# Patient Record
Sex: Male | Born: 1960 | Race: Black or African American | Hispanic: No | Marital: Single | Smoking: Never smoker
Health system: Southern US, Community
[De-identification: ages and names within clinical notes are randomized; demographics above are authoritative.]

## PROBLEM LIST (undated history)

## (undated) DIAGNOSIS — T7840XA Allergy, unspecified, initial encounter: Secondary | ICD-10-CM

## (undated) DIAGNOSIS — R9431 Abnormal electrocardiogram [ECG] [EKG]: Secondary | ICD-10-CM

## (undated) DIAGNOSIS — D649 Anemia, unspecified: Secondary | ICD-10-CM

## (undated) DIAGNOSIS — R7611 Nonspecific reaction to tuberculin skin test without active tuberculosis: Secondary | ICD-10-CM

## (undated) DIAGNOSIS — Z8701 Personal history of pneumonia (recurrent): Secondary | ICD-10-CM

## (undated) HISTORY — DX: Nonspecific reaction to tuberculin skin test without active tuberculosis: R76.11

## (undated) HISTORY — DX: Anemia, unspecified: D64.9

## (undated) HISTORY — DX: Personal history of pneumonia (recurrent): Z87.01

## (undated) HISTORY — PX: DENTAL SURGERY: SHX609

## (undated) HISTORY — PX: COLONOSCOPY: SHX174

## (undated) HISTORY — DX: Abnormal electrocardiogram (ECG) (EKG): R94.31

## (undated) HISTORY — DX: Allergy, unspecified, initial encounter: T78.40XA

---

## 1999-10-27 DIAGNOSIS — R7611 Nonspecific reaction to tuberculin skin test without active tuberculosis: Secondary | ICD-10-CM

## 1999-10-27 HISTORY — DX: Nonspecific reaction to tuberculin skin test without active tuberculosis: R76.11

## 2007-01-03 ENCOUNTER — Encounter: Payer: Self-pay | Admitting: Internal Medicine

## 2007-01-11 ENCOUNTER — Encounter: Payer: Self-pay | Admitting: Internal Medicine

## 2009-03-08 ENCOUNTER — Encounter: Payer: Self-pay | Admitting: Internal Medicine

## 2009-12-02 ENCOUNTER — Encounter: Payer: Self-pay | Admitting: Internal Medicine

## 2009-12-23 ENCOUNTER — Ambulatory Visit: Payer: Self-pay | Admitting: Internal Medicine

## 2009-12-23 DIAGNOSIS — Z8709 Personal history of other diseases of the respiratory system: Secondary | ICD-10-CM | POA: Insufficient documentation

## 2009-12-23 DIAGNOSIS — J309 Allergic rhinitis, unspecified: Secondary | ICD-10-CM | POA: Insufficient documentation

## 2009-12-23 DIAGNOSIS — D649 Anemia, unspecified: Secondary | ICD-10-CM

## 2009-12-23 DIAGNOSIS — D700 Congenital agranulocytosis: Secondary | ICD-10-CM | POA: Insufficient documentation

## 2009-12-23 LAB — CONVERTED CEMR LAB
Basophils Absolute: 0 10*3/uL (ref 0.0–0.1)
Eosinophils Absolute: 0.2 10*3/uL (ref 0.0–0.7)
Hemoglobin: 12.7 g/dL — ABNORMAL LOW (ref 13.0–17.0)
Lymphocytes Relative: 29.1 % (ref 12.0–46.0)
MCHC: 33.3 g/dL (ref 30.0–36.0)
Monocytes Relative: 13.7 % — ABNORMAL HIGH (ref 3.0–12.0)
Neutrophils Relative %: 48.3 % (ref 43.0–77.0)
PSA: 0.63 ng/mL (ref 0.10–4.00)
RDW: 12.2 % (ref 11.5–14.6)

## 2010-11-25 NOTE — Letter (Signed)
Summary: [facility]   Imported By: Bubba Hales 12/25/2009 09:56:59  _____________________________________________________________________  External Attachment:    Type:   Image     Comment:   External Document

## 2010-11-25 NOTE — Letter (Signed)
Summary: [facility]   Imported By: Bubba Hales 12/25/2009 09:51:14  _____________________________________________________________________  External Attachment:    Type:   Image     Comment:   External Document

## 2010-11-25 NOTE — Letter (Signed)
Summary: Generic Letter  [facility]   [place], [place] [address]   Phone: [phone]  Fax: [phone]      12/23/2009  [place], [place]  [address]  Dear Mr. SELIGA,  This letter serves to document that I have evaluated your problem with a history of low RBC and WBC counts. These abnormalities date back to 1997 and have been persistent and unchanged since then. 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 Your WBC and RBC counts are genetically determined and don't fit into the normal range but this is not an abnormality that presents a medical issue for you.   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 Please contact me for any further concerns.         Sincerely,   Scarlette Calico MD

## 2010-11-25 NOTE — Assessment & Plan Note (Signed)
Summary: NEW / SELF PAY - $184  / # / CD   Vital Signs:  Patient profile:   50 year old male Height:      68 inches Weight:      162.50 pounds BMI:     24.80 O2 Sat:      99 % on Room air Temp:     97.6 degrees F oral Pulse rate:   59 / minute Pulse rhythm:   regular Resp:     16 per minute BP sitting:   124 / 78  (left arm)  Vitals Entered By: Ernestene Mention (December 23, 2009 10:26 AM)  O2 Flow:  Room air CC: NP--est care-C/O sore throat x 2 weeks and runny nose./kb, Preventive Care Is Patient Diabetic? No Pain Assessment Patient in pain? no      Comments Patient denies cough and producing mucous./kb   Primary Care Provider:  Ronnald Ramp  CC:  NP--est care-C/O sore throat x 2 weeks and runny nose./kb and Preventive Care.  History of Present Illness: New to me this gentleman presents with a request to do a repeat CBC and a screening PSA. He was found to have a low WBC and RBC count on labs done during a complete physical on 12/02/09. I reviewed his labs dating back about 15 years ago and it shows a similar finding that has  persisted, unchanged since then (see scanned records). He also requests a screening PSA be done.  Preventive Screening-Counseling & Management  Alcohol-Tobacco     Alcohol drinks/day: <1     Alcohol type: beer     >5/day in last 3 mos: no     Alcohol Counseling: not indicated; use of alcohol is not excessive or problematic     Feels need to cut down: no     Feels annoyed by complaints: no     Feels guilty re: drinking: yes     Needs 'eye opener' in am: no     Smoking Status: never  Caffeine-Diet-Exercise     Does Patient Exercise: yes  Hep-HIV-STD-Contraception     Hepatitis Risk: no risk noted     HIV Risk: no risk noted     STD Risk: no risk noted     TSE monthly: yes     Testicular SE Education/Counseling to perform regular STE      Sexual History:  currently monogamous.        Drug Use:  never and no.        Blood Transfusions:  no.     Current Medications (verified): 1)  Vitamins .... 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 [facility]H) Allergic rhinitis Anemia-NOS  Past Surgical History: None  Family History: Family History High cholesterol Family History Hypertension  Social History: Never Smoked Alcohol use-yes Drug use-no Occupation: Charity fundraiser Regular exercise-yes Smoking Status:  never Drug Use:  never, no Hepatitis Risk:  no risk noted HIV Risk:  no risk noted STD Risk:  no risk noted Sexual History:  currently monogamous Blood Transfusions:  no Does Patient Exercise:  yes  Review of Systems  The patient denies anorexia, fever, weight loss, weight gain, chest pain, syncope, dyspnea on exertion, peripheral edema, prolonged cough, headaches, hemoptysis, abdominal pain, suspicious skin lesions, enlarged lymph nodes, and angioedema.   Heme:  Denies abnormal bruising, bleeding, enlarge lymph nodes,  fevers, pallor, and skin discoloration.  Physical Exam  General:  alert, well-developed, well-nourished, well-hydrated, appropriate dress, normal appearance, healthy-appearing, cooperative to examination, and good hygiene.   Head:  normocephalic, atraumatic, no abnormalities observed, and no abnormalities palpated.   Eyes:  vision grossly intact, pupils equal, pupils round, and pupils reactive to light.   Ears:  R ear normal and L ear normal.   Mouth:  Oral mucosa and oropharynx without lesions or exudates.  Teeth in good repair. Neck:  supple, full ROM, no masses, no thyromegaly, no thyroid nodules or tenderness, no JVD, no carotid bruits, no cervical lymphadenopathy, and no neck tenderness.   Breasts:  No masses or gynecomastia noted Lungs:  normal respiratory effort, no intercostal retractions, no accessory muscle use, normal breath sounds, no  dullness, no fremitus, no crackles, and no wheezes.   Heart:  normal rate, regular rhythm, no murmur, no gallop, no rub, and no JVD.   Abdomen:  soft, non-tender, normal bowel sounds, no distention, no masses, no guarding, no rigidity, no rebound tenderness, no hepatomegaly, and no splenomegaly.   Rectal:  No external abnormalities noted. Normal sphincter tone. No rectal masses or tenderness. Genitalia:  Testes bilaterally descended without nodularity, tenderness or masses. No scrotal masses or lesions. No penis lesions or urethral discharge. Prostate:  Prostate gland firm and smooth, no enlargement, nodularity, tenderness, mass, asymmetry or induration. Msk:  normal ROM, no joint tenderness, no joint swelling, no joint warmth, no redness over joints, no joint deformities, no joint instability, no crepitation, and no muscle atrophy.   Pulses:  R and L carotid,radial,femoral,dorsalis pedis and posterior tibial pulses are full and equal bilaterally Extremities:  No clubbing, cyanosis, edema, or deformity noted with normal full range of motion of all joints.   Neurologic:  No cranial nerve deficits noted. Station and gait are normal. Plantar reflexes are down-going bilaterally. DTRs are symmetrical throughout. Sensory, motor and coordinative functions appear intact. Skin:  turgor normal, color normal, no rashes, no suspicious lesions, no ecchymoses, no petechiae, no purpura, no ulcerations, and no edema.   Cervical Nodes:  no anterior cervical adenopathy and no posterior cervical adenopathy.   Axillary Nodes:  no R axillary adenopathy and no L axillary adenopathy.   Inguinal Nodes:  no R inguinal adenopathy and no L inguinal adenopathy.   Psych:  Cognition and judgment appear intact. Alert and cooperative with normal attention span and concentration. 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  I reiterated the need for healthy,safe living with respect to monogamous and protected sex, limited use of alcohol, avoiding drug abuse, no risk taking, and safe driving/seat belt usage.  Orders: Venipuncture (17793) TLB-CBC Platelet - w/Differential (85025-CBCD) TLB-PSA (Prostate Specific Antigen) (84153-PSA)  Complete Medication List: 1)  Vitamins  .... By mouth qd  Colorectal Screening:  Current Recommendations:    Hemoccult: NEG X 1 today  PSA Screening:    Reviewed PSA screening recommendations: PSA ordered  Immunization & Chemoprophylaxis:    Tetanus vaccine: Td  (05/10/2002)  Patient Instructions: 1)  Please schedule a follow-up appointment as needed. 2)  If you could be  exposed to sexually transmitted diseases, you should use a condom.   Tetanus/Td Immunization History:    Tetanus/Td # 1:  Td (05/10/2002)

## 2011-04-01 ENCOUNTER — Ambulatory Visit (INDEPENDENT_AMBULATORY_CARE_PROVIDER_SITE_OTHER): Payer: Self-pay | Admitting: Internal Medicine

## 2011-04-01 ENCOUNTER — Encounter: Payer: Self-pay | Admitting: Internal Medicine

## 2011-04-01 ENCOUNTER — Ambulatory Visit (INDEPENDENT_AMBULATORY_CARE_PROVIDER_SITE_OTHER)
Admission: RE | Admit: 2011-04-01 | Discharge: 2011-04-01 | Disposition: A | Discharge status: Home / Self Care | Payer: Self-pay | Source: Ambulatory Visit | Attending: Internal Medicine | Admitting: Internal Medicine

## 2011-04-01 VITALS — BP 124/72 | HR 82 | Temp 98.0°F | Resp 16 | Wt 160.0 lb

## 2011-04-01 DIAGNOSIS — R7611 Nonspecific reaction to tuberculin skin test without active tuberculosis: Secondary | ICD-10-CM

## 2011-04-01 NOTE — Patient Instructions (Signed)
Health Maintenance in [place] a healthy diet and normal weight. Increased weight leads to problems with blood pressure and diabetes. Decrease fat in the diet and increase exercise. Obtain a proper diet from your caregiver if necessary.   High blood pressure causes heart and blood vessel problems. Check blood pressures regularly and keep your blood pressure at normal limits. Aerobic exercise helps this. Persistent elevations of blood pressure should be treated with medications if weight loss and exercise are ineffective.   Avoid smoking, drinking in excess (more than 2 drinks per day), or use of street drugs. Do not share needles with anyone. Ask for help if you need assistance or instructions on stopping the use of alcohol, cigarettes, or drugs.   Maintain normal blood lipids and cholesterol. Your caregiver can give you information to lower your risk of heart disease or stroke.   Ask your caregiver if you are in need of early heart disease screening because of a strong family history of heart disease or signs of elevated testosterone (male sex hormone) levels. These can predispose you to early heart disease.   Practice safe sex. Practicing safe sex decreases your risk for a sexually transmitted infection (STI). Some of the STIs are gonorrhea, chlamydia, syphilis, trichimonas, herpes, human papillomavirus (HPV), and human immunodeficiency virus (HIV). Herpes, HIV, and HPV are viral illnesses that have no cure. These can result in disability, cancer, and death.   It is not safe for someone who has AIDS or is HIV positive to have unprotected sex with a partner who is HIV positive. The reason for this is the fact that there are many different strains of HIV. If you have a strain that is readily treated with medications and then suddenly introduce a strain from a partner that has no further treatment options, you may suddenly have a strain of HIV that is untreatable.  Even if you are both positive for HIV, it is still necessary to practice safe sex.   Use sunscreen with a SPF of 15 or greater. Being outside in the sun when your shadow caused by the sun is shorter than you are, means you are being exposed to sun at greater intensity. Lighter skinned people are at a greater risk of skin cancer.   Keep carbon monoxide and smoke detectors in your home and functioning at all times. Change the batteries every 6 months.   Do monthly examinations of your testicles. The best time to do this is after a hot shower or bath when the tissues are loose. Notify your caregivers of any lumps, tenderness, or changes in size or shape.   Notify your caregiver of new moles or changes in moles, especially if there is a change in shape or color. Also notify your caregiver if a mole is larger than the size of a pencil eraser.   Stay current with your tetanus shots and other required immunizations.  The Body Mass Index (BMI) is a way of measuring how much of your body is fat. Having a BMI above 27 increases the risk of heart disease, diabetes, hypertension, stroke, and other problems related to obesity. Document Released: 04/09/2008 Document Re-Released: 04/01/2010 [facility] Patient Information 2011 [place].

## 2011-04-02 ENCOUNTER — Telehealth: Payer: Self-pay | Admitting: *Deleted

## 2011-04-02 ENCOUNTER — Encounter: Payer: Self-pay | Admitting: Internal Medicine

## 2011-04-02 NOTE — Telephone Encounter (Signed)
Yes, normal

## 2011-04-02 NOTE — Progress Notes (Signed)
  Subjective:    Patient ID: Dominic Coleman, male    DOB: 1961/06/26, 50 y.o.   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 Today he feels well and he offers no complaints.    Review of Systems  Constitutional: Negative for fever, chills, diaphoresis, activity change, appetite change, fatigue and unexpected weight change.  HENT: Negative for facial swelling, trouble swallowing, neck pain, neck stiffness and voice change.   Respiratory: Negative for cough, shortness of breath, wheezing and stridor.   Cardiovascular: Negative for chest pain, palpitations and leg swelling.  Gastrointestinal: Negative for nausea, vomiting, abdominal pain, diarrhea and constipation.  Musculoskeletal: Negative for myalgias, back pain, joint swelling, arthralgias and gait problem.  Skin: Negative for pallor and rash.  Hematological: Negative for adenopathy. Does not bruise/bleed easily.  Psychiatric/Behavioral: Negative.        Objective:   Physical Exam  [vitalsreviewed. Constitutional: He is oriented to person, place, and time. He appears well-developed and well-nourished. No distress.  HENT:  Head: Normocephalic and atraumatic.  Right Ear: External ear normal.  Left Ear: External ear normal.  Nose: Nose normal.  Mouth/Throat: Oropharynx is clear and moist. No oropharyngeal exudate.  Eyes: Conjunctivae and EOM are normal. Pupils are equal, round, and reactive to light. Right eye exhibits no discharge. Left eye exhibits no discharge. No scleral icterus.  Neck: Normal range of motion. Neck supple. No JVD present. No tracheal deviation present. No thyromegaly present.  Cardiovascular: Normal rate, regular rhythm, normal heart sounds and  intact distal pulses.  Exam reveals no gallop and no friction rub.   No murmur heard. Pulmonary/Chest: Effort normal and breath sounds normal. No stridor. No respiratory distress. He has no wheezes. He has no rales. He exhibits no tenderness.  Abdominal: Soft. Bowel sounds are normal. He exhibits no distension and no mass. There is no tenderness. There is no rebound and no guarding.  Musculoskeletal: Normal range of motion. He exhibits no edema and no tenderness.  Lymphadenopathy:    He has no cervical adenopathy.  Neurological: He is alert and oriented to person, place, and time. He has normal reflexes. He displays normal reflexes. He exhibits normal muscle tone.  Skin: Skin is warm and dry. No rash noted. He is not diaphoretic. No erythema. No pallor.  Psychiatric: He has a normal mood and affect. His behavior is normal. Judgment and thought content normal.          Assessment & Plan:

## 2011-04-02 NOTE — Telephone Encounter (Signed)
Patient notified

## 2011-04-02 NOTE — Assessment & Plan Note (Signed)
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

## 2011-04-02 NOTE — Telephone Encounter (Signed)
Patient requesting results of CXR, normal correct?

## 2013-04-26 ENCOUNTER — Other Ambulatory Visit (INDEPENDENT_AMBULATORY_CARE_PROVIDER_SITE_OTHER): Payer: Managed Care, Other (non HMO)

## 2013-04-26 ENCOUNTER — Ambulatory Visit (INDEPENDENT_AMBULATORY_CARE_PROVIDER_SITE_OTHER): Payer: Managed Care, Other (non HMO) | Admitting: Internal Medicine

## 2013-04-26 ENCOUNTER — Ambulatory Visit (INDEPENDENT_AMBULATORY_CARE_PROVIDER_SITE_OTHER)
Admission: RE | Admit: 2013-04-26 | Discharge: 2013-04-26 | Disposition: A | Discharge status: Home / Self Care | Payer: Managed Care, Other (non HMO) | Source: Ambulatory Visit | Attending: Internal Medicine | Admitting: Internal Medicine

## 2013-04-26 ENCOUNTER — Encounter: Payer: Self-pay | Admitting: Internal Medicine

## 2013-04-26 VITALS — BP 124/80 | HR 65 | Temp 97.8°F | Resp 16 | Ht 68.0 in | Wt 158.0 lb

## 2013-04-26 DIAGNOSIS — R911 Solitary pulmonary nodule: Secondary | ICD-10-CM | POA: Insufficient documentation

## 2013-04-26 DIAGNOSIS — Z0000 Encounter for general adult medical examination without abnormal findings: Secondary | ICD-10-CM | POA: Insufficient documentation

## 2013-04-26 DIAGNOSIS — R7611 Nonspecific reaction to tuberculin skin test without active tuberculosis: Secondary | ICD-10-CM

## 2013-04-26 LAB — CBC WITH DIFFERENTIAL/PLATELET
Basophils Absolute: 0 10*3/uL (ref 0.0–0.1)
HCT: 38.9 % — ABNORMAL LOW (ref 39.0–52.0)
Lymphocytes Relative: 33.2 % (ref 12.0–46.0)
Lymphs Abs: 1.1 10*3/uL (ref 0.7–4.0)
Monocytes Relative: 14.7 % — ABNORMAL HIGH (ref 3.0–12.0)
Neutrophils Relative %: 49.2 % (ref 43.0–77.0)
Platelets: 279 10*3/uL (ref 150.0–400.0)
RDW: 12.9 % (ref 11.5–14.6)

## 2013-04-26 LAB — URINALYSIS, ROUTINE W REFLEX MICROSCOPIC
Nitrite: NEGATIVE
RBC / HPF: NONE SEEN (ref 0–?)
Specific Gravity, Urine: 1.025 (ref 1.000–1.030)
Total Protein, Urine: NEGATIVE
Urine Glucose: NEGATIVE
pH: 6 (ref 5.0–8.0)

## 2013-04-26 LAB — COMPREHENSIVE METABOLIC PANEL
ALT: 19 U/L (ref 0–53)
CO2: 30 mEq/L (ref 19–32)
GFR: 102.8 mL/min (ref >60.00)
Potassium: 4.1 mEq/L (ref 3.5–5.1)
Sodium: 140 mEq/L (ref 135–145)
Total Bilirubin: 0.7 mg/dL (ref 0.3–1.2)
Total Protein: 7.1 g/dL (ref 6.0–8.3)

## 2013-04-26 LAB — CMP14+EGFR
ALT: 31 U/L (ref 10–40)
Albumin: 4.5
Calcium: 9.6 mg/dL
Chloride, Serum: 102
EGFR: 115 mg/dL
Glucose: 81
Potassium, serum: 4.5
Protein S Total: 7.3

## 2013-04-26 LAB — LIPID PANEL: Cholesterol: 188 mg/dL (ref 0–200)

## 2013-04-26 LAB — TSH: TSH: 1.55 u[IU]/mL (ref 0.35–5.50)

## 2013-04-26 LAB — PSA: PSA: 0.87 ng/mL (ref 0.10–4.00)

## 2013-04-26 NOTE — Progress Notes (Signed)
  Subjective:    Patient ID: Dominic Coleman, male    DOB: [birth year], 52 y.o.   MRN: [id number]  HPI  He returns for a complete physical and he tells me that she feels well and offers no complaints.   Review of Systems  Constitutional: Negative.  Negative for fever, chills, diaphoresis, activity change, appetite change, fatigue and unexpected weight change.  HENT: Negative.   Eyes: Negative.   Respiratory: Negative.  Negative for cough, chest tightness, shortness of breath, wheezing and stridor.   Cardiovascular: Negative.  Negative for chest pain, palpitations and leg swelling.  Gastrointestinal: Negative.  Negative for nausea, vomiting, abdominal pain, diarrhea and constipation.  Endocrine: Negative.   Genitourinary: Negative.   Musculoskeletal: Negative.   Skin: Negative.   Allergic/Immunologic: Negative.   Neurological: Negative.   Hematological: Negative.  Negative for adenopathy. Does not bruise/bleed easily.  Psychiatric/Behavioral: Negative.        Objective:   Physical Exam  Vitals reviewed. Constitutional: He is oriented to person, place, and time. He appears well-developed and well-nourished. No distress.  HENT:  Head: Normocephalic and atraumatic.  Mouth/Throat: Oropharynx is clear and moist. No oropharyngeal exudate.  Eyes: Conjunctivae are normal. Right eye exhibits no discharge. Left eye exhibits no discharge. No scleral icterus.  Neck: Normal range of motion. Neck supple. No JVD present. No tracheal deviation present. No thyromegaly present.  Cardiovascular: Normal rate, regular rhythm, normal heart sounds and intact distal pulses.  Exam reveals no gallop and no friction rub.   No murmur heard. Pulmonary/Chest: Effort normal and breath sounds normal. No stridor. No respiratory distress. He has no wheezes. He has no rales. He exhibits no tenderness.  Abdominal: Soft. Bowel sounds are normal. He exhibits no distension and no mass. There is no tenderness. There is no  rebound and no guarding. Hernia confirmed negative in the right inguinal area and confirmed negative in the left inguinal area.  Genitourinary: Rectum normal, prostate normal, testes normal and penis normal. Rectal exam shows no external hemorrhoid, no internal hemorrhoid, no fissure, no mass, no tenderness and anal tone normal. Guaiac negative stool. Prostate is not enlarged and not tender. Right testis shows no mass, no swelling and no tenderness. Right testis is descended. Left testis shows no mass, no swelling and no tenderness. Left testis is descended. Uncircumcised. No phimosis, paraphimosis, hypospadias, penile erythema or penile tenderness. No discharge found.  Musculoskeletal: Normal range of motion. He exhibits no edema and no tenderness.  Lymphadenopathy:    He has no cervical adenopathy.       Right: No inguinal adenopathy present.       Left: No inguinal adenopathy present.  Neurological: He is oriented to person, place, and time.  Skin: Skin is warm and dry. No rash noted. He is not diaphoretic. No erythema. No pallor.  Psychiatric: He has a normal mood and affect. His behavior is normal. Judgment and thought content normal.     Lab Results  Component Value Date   WBC 3.2* 12/23/2009   HGB 12.7* 12/23/2009   HCT 38.2* 12/23/2009   PLT 265.0 12/23/2009   PSA 0.63 12/23/2009       Assessment & Plan:

## 2013-04-26 NOTE — Assessment & Plan Note (Signed)
Ct scan ordered.

## 2013-04-26 NOTE — Patient Instructions (Signed)
Health Maintenance, Males A healthy lifestyle and preventative care can promote health and wellness.  Maintain regular health, dental, and eye exams.  Eat a healthy diet. Foods like vegetables, fruits, whole grains, low-fat dairy products, and lean protein foods contain the nutrients you need without too many calories. Decrease your intake of foods high in solid fats, added sugars, and salt. Get information about a proper diet from your caregiver, if necessary.  Regular physical exercise is one of the most important things you can do for your health. Most adults should get at least 150 minutes of moderate-intensity exercise (any activity that increases your heart rate and causes you to sweat) each week. In addition, most adults need muscle-strengthening exercises on 2 or more days a week.   Maintain a healthy weight. The body mass index (BMI) is a screening tool to identify possible weight problems. It provides an estimate of body fat based on height and weight. Your caregiver can help determine your BMI, and can help you achieve or maintain a healthy weight. For adults 20 years and older:  A BMI below 18.5 is considered underweight.  A BMI of 18.5 to 24.9 is normal.  A BMI of 25 to 29.9 is considered overweight.  A BMI of 30 and above is considered obese.  Maintain normal blood lipids and cholesterol by exercising and minimizing your intake of saturated fat. Eat a balanced diet with plenty of fruits and vegetables. Blood tests for lipids and cholesterol should begin at age 20 and be repeated every 5 years. If your lipid or cholesterol levels are high, you are over 50, or you are a high risk for heart disease, you may need your cholesterol levels checked more frequently.Ongoing high lipid and cholesterol levels should be treated with medicines, if diet and exercise are not effective.  If you smoke, find out from your caregiver how to quit. If you do not use tobacco, do not start.  If you  choose to drink alcohol, do not exceed 2 drinks per day. One drink is considered to be 12 ounces (355 mL) of beer, 5 ounces (148 mL) of wine, or 1.5 ounces (44 mL) of liquor.  Avoid use of street drugs. Do not share needles with anyone. Ask for help if you need support or instructions about stopping the use of drugs.  High blood pressure causes heart disease and increases the risk of stroke. Blood pressure should be checked at least every 1 to 2 years. Ongoing high blood pressure should be treated with medicines if weight loss and exercise are not effective.  If you are 45 to 50 years old, ask your caregiver if you should take aspirin to prevent heart disease.  Diabetes screening involves taking a blood sample to check your fasting blood sugar level. This should be done once every 3 years, after age 45, if you are within normal weight and without risk factors for diabetes. Testing should be considered at a younger age or be carried out more frequently if you are overweight and have at least 1 risk factor for diabetes.  Colorectal cancer can be detected and often prevented. Most routine colorectal cancer screening begins at the age of 50 and continues through age 75. However, your caregiver may recommend screening at an earlier age if you have risk factors for colon cancer. On a yearly basis, your caregiver may provide home test kits to check for hidden blood in the stool. Use of a small camera at the end of a tube,   to directly examine the colon (sigmoidoscopy or colonoscopy), can detect the earliest forms of colorectal cancer. Talk to your caregiver about this at age 50, when routine screening begins. Direct examination of the colon should be repeated every 5 to 10 years through age 75, unless early forms of pre-cancerous polyps or small growths are found.  Hepatitis C blood testing is recommended for all people born from 1945 through 1965 and any individual with known risks for hepatitis C.  Healthy  men should no longer receive prostate-specific antigen (PSA) blood tests as part of routine cancer screening. Consult with your caregiver about prostate cancer screening.  Testicular cancer screening is not recommended for adolescents or adult males who have no symptoms. Screening includes self-exam, caregiver exam, and other screening tests. Consult with your caregiver about any symptoms you have or any concerns you have about testicular cancer.  Practice safe sex. Use condoms and avoid high-risk sexual practices to reduce the spread of sexually transmitted infections (STIs).  Use sunscreen with a sun protection factor (SPF) of 30 or greater. Apply sunscreen liberally and repeatedly throughout the day. You should seek shade when your shadow is shorter than you. Protect yourself by wearing long sleeves, pants, a wide-brimmed hat, and sunglasses year round, whenever you are outdoors.  Notify your caregiver of new moles or changes in moles, especially if there is a change in shape or color. Also notify your caregiver if a mole is larger than the size of a pencil eraser.  A one-time screening for abdominal aortic aneurysm (AAA) and surgical repair of large AAAs by sound wave imaging (ultrasonography) is recommended for ages 65 to 75 years who are current or former smokers.  Stay current with your immunizations. Document Released: 04/09/2008 Document Revised: 01/04/2012 Document Reviewed: 03/09/2011 [employer] Patient Information 2014 [employer].  

## 2013-04-26 NOTE — Assessment & Plan Note (Signed)
CXR done today, nodule noted He has no s/s suggestive of active TB

## 2013-04-26 NOTE — Assessment & Plan Note (Signed)
Exam done  Vaccines were reviewed He was referred for a colonoscopy Labs ordered Pt ed material was given

## 2013-05-01 ENCOUNTER — Encounter: Payer: Self-pay | Admitting: Internal Medicine

## 2013-05-01 ENCOUNTER — Ambulatory Visit (INDEPENDENT_AMBULATORY_CARE_PROVIDER_SITE_OTHER)
Admission: RE | Admit: 2013-05-01 | Discharge: 2013-05-01 | Disposition: A | Discharge status: Home / Self Care | Payer: Managed Care, Other (non HMO) | Source: Ambulatory Visit | Attending: Internal Medicine | Admitting: Internal Medicine

## 2013-05-01 DIAGNOSIS — R7611 Nonspecific reaction to tuberculin skin test without active tuberculosis: Secondary | ICD-10-CM

## 2013-05-01 DIAGNOSIS — R911 Solitary pulmonary nodule: Secondary | ICD-10-CM

## 2013-05-01 MED ORDER — IOHEXOL 300 MG/ML  SOLN
80.0000 mL | Freq: Once | INTRAMUSCULAR | Status: AC | PRN
  Administered 2013-05-01: 80 mL via INTRAVENOUS

## 2013-05-02 ENCOUNTER — Encounter: Payer: Self-pay | Admitting: Internal Medicine

## 2013-05-03 ENCOUNTER — Encounter: Payer: Self-pay | Admitting: Gastroenterology

## 2013-05-07 ENCOUNTER — Encounter (HOSPITAL_BASED_OUTPATIENT_CLINIC_OR_DEPARTMENT_OTHER): Payer: Self-pay | Admitting: *Deleted

## 2013-05-07 ENCOUNTER — Emergency Department (HOSPITAL_BASED_OUTPATIENT_CLINIC_OR_DEPARTMENT_OTHER)
Admission: EM | Admit: 2013-05-07 | Discharge: 2013-05-08 | Disposition: A | Discharge status: Home / Self Care | Payer: Managed Care, Other (non HMO) | Attending: Emergency Medicine | Admitting: Emergency Medicine

## 2013-05-07 DIAGNOSIS — Z862 Personal history of diseases of the blood and blood-forming organs and certain disorders involving the immune mechanism: Secondary | ICD-10-CM | POA: Insufficient documentation

## 2013-05-07 DIAGNOSIS — R21 Rash and other nonspecific skin eruption: Secondary | ICD-10-CM | POA: Insufficient documentation

## 2013-05-07 DIAGNOSIS — Z8701 Personal history of pneumonia (recurrent): Secondary | ICD-10-CM | POA: Insufficient documentation

## 2013-05-07 MED ORDER — FAMOTIDINE IN NACL 20-0.9 MG/50ML-% IV SOLN
20.0000 mg | Freq: Once | INTRAVENOUS | Status: AC
  Administered 2013-05-07: 20 mg via INTRAVENOUS
  Filled 2013-05-07: qty 50

## 2013-05-07 MED ORDER — DIPHENHYDRAMINE HCL 50 MG/ML IJ SOLN
50.0000 mg | Freq: Once | INTRAMUSCULAR | Status: AC
  Administered 2013-05-07: 50 mg via INTRAVENOUS
  Filled 2013-05-07: qty 1

## 2013-05-07 MED ORDER — SODIUM CHLORIDE 0.9 % IV SOLN
Freq: Once | INTRAVENOUS | Status: AC
  Administered 2013-05-07: 1000 mL via INTRAVENOUS

## 2013-05-07 MED ORDER — DEXAMETHASONE SODIUM PHOSPHATE 10 MG/ML IJ SOLN
10.0000 mg | Freq: Once | INTRAMUSCULAR | Status: AC
  Administered 2013-05-07: 10 mg via INTRAVENOUS
  Filled 2013-05-07: qty 1

## 2013-05-07 NOTE — ED Provider Notes (Signed)
History    This chart was scribed for Dominic Fines, MD, by Dominic Coleman, ED Scribe. This patient was seen in room MH11/MH11 and the patient's care was started at 11:05 PM  CSN: [id number] Arrival date & time 05/07/13  2223  First MD Initiated Contact with Patient 05/07/13 2304     Chief Complaint  Patient presents with  . Allergic Reaction    HPI HPI Comments: Dominic Coleman, a 52 y.o. male, presents to the Emergency Department complaining of a rash. He reports he had to change his bicycle tire in the woods yesterday, and he came into contact with several different plants. The rash began this morning around his pelvis and has spread to his chest, back, and extremities. The pt reports that his inguinal lymph nodes are also swollen. The pt denies any SOB, nausea, emesis, diarrhea, fever, or a cough. The pt also denies eating or drinking anything to which he has a known allergy. The pt took one benadryl this morning, without any resolution. He has a h/o of anemia. The pt denies smoking and he denies using alcohol.   Past Medical History  Diagnosis Date  . Anemia   . Allergy   . History of pneumonia   . Positive PPD   . Abnormal EKG     work up at [employer] in 2008 (+conc. LVH)  . PPD positive 2001   History reviewed. No pertinent past surgical history. Family History  Problem Relation Age of Onset  . Hypertension Other   . Hyperlipidemia Other   . Cancer Neg Hx   . Diabetes Neg Hx   . Early death Neg Hx   . Hearing loss Neg Hx   . Heart disease Neg Hx   . Kidney disease Neg Hx   . Stroke Neg Hx   . Alcohol abuse Neg Hx    History  Substance Use Topics  . Smoking status: Never Smoker   . Smokeless tobacco: Not on file  . Alcohol Use: No    Review of Systems  Constitutional: Negative for fever.  Respiratory: Negative for cough, shortness of breath and wheezing.   Cardiovascular: Negative for chest pain.  Gastrointestinal: Negative for nausea, vomiting and diarrhea.   Skin: Positive for rash.    Allergies  Review of patient's allergies indicates no known allergies.  Home Medications   Current Outpatient Rx  Name  Route  Sig  Dispense  Refill  . Multiple Vitamin (MULTIVITAMIN) tablet   Oral   Take 1 tablet by mouth daily.            Triage Vitals: BP 124/64  Pulse 69  Temp(Src) 98.8 F (37.1 C) (Oral)  Resp 20  Ht 5' 8"  (1.727 m)  Wt 155 lb (70.308 kg)  BMI 23.57 kg/m2  SpO2 100%  Physical Exam  Nursing note and vitals reviewed. Constitutional: He is oriented to person, place, and time. He appears well-developed and well-nourished. No distress.  HENT:  Head: Normocephalic and atraumatic.  Right Ear: External ear normal.  Left Ear: External ear normal.  Nose: Nose normal.  Mouth/Throat: Oropharynx is clear and moist.  Eyes: Conjunctivae and EOM are normal. Pupils are equal, round, and reactive to light.  Neck: Neck supple. No tracheal deviation present.  Cardiovascular: Normal rate.   Pulmonary/Chest: Effort normal and breath sounds normal. No respiratory distress. He has no wheezes.  Musculoskeletal: Normal range of motion.  Lymphadenopathy:    He has no cervical adenopathy.  Inguinal adenopathy  Neurological: He is alert and oriented to person, place, and time.  Skin: Skin is warm and dry.  Generalized urticarial rash most prominent on face, neck, and trunk, becomes sparse on lower legs and forearms. There are areas of confluence.   Psychiatric: He has a normal mood and affect. His behavior is normal.    ED Course  Procedures (including critical care time)  DIAGNOSTIC STUDIES: Oxygen Saturation is 100% on room air, normal by my interpretation.    COORDINATION OF CARE:  11:08PM-Discussed treatment plan with patient, and the patient agreed to the plan.    MDM  1:19 AM Rash fading but still present. The itching significantly improved after IV medications. Rash is most consistent with urticaria with somewhat more  erythematous.  I personally performed the services described in this documentation, which was scribed in my presence.  The recorded information has been reviewed and is accurate.    Dominic Fines, MD 05/08/13 [phone]

## 2013-05-07 NOTE — ED Notes (Signed)
Pt states when he woke up this morning he had a rash that had progressively gotten worse. Hives noted to general body. Took Benardyl this AM that has not helped. Denies any new products, medications, or foods. Denies sob. Main complaint is itching.

## 2013-05-07 NOTE — ED Notes (Signed)
Pt complains of rash is that getting worse since this am even with taking benadryl.  Pt also complains of groin lymph nodes being swollen and tender to touch.  Pt reports in the woods changing a tire but no new medications, detergents or soaps.  Complains of the rash itching but not painful.  Denies shortness of breath.  Denies fever.

## 2013-05-08 MED ORDER — HYDROXYZINE HCL 25 MG PO TABS: 25.0000 mg | ORAL_TABLET | Freq: Four times a day (QID) | ORAL | Status: DC | PRN

## 2013-05-08 NOTE — ED Notes (Signed)
Pt reports feeling better.  Hives and rash appears to be slightly decreasing.  Pt and family agree but rash is still present.

## 2013-06-02 ENCOUNTER — Ambulatory Visit (AMBULATORY_SURGERY_CENTER): Payer: Managed Care, Other (non HMO)

## 2013-06-02 VITALS — Ht 68.0 in | Wt 159.8 lb

## 2013-06-02 DIAGNOSIS — Z1211 Encounter for screening for malignant neoplasm of colon: Secondary | ICD-10-CM

## 2013-06-02 MED ORDER — MOVIPREP 100 G PO SOLR: 1.0000 | Freq: Once | ORAL | Status: DC

## 2013-06-02 NOTE — Progress Notes (Signed)
No egg or soy allergy. No anesthesia problems.  

## 2013-06-19 ENCOUNTER — Encounter: Payer: Self-pay | Admitting: Gastroenterology

## 2013-06-19 ENCOUNTER — Ambulatory Visit (AMBULATORY_SURGERY_CENTER): Payer: Managed Care, Other (non HMO) | Admitting: Gastroenterology

## 2013-06-19 VITALS — BP 123/78 | HR 53 | Temp 100.0°F | Resp 20 | Ht 68.0 in | Wt 159.0 lb

## 2013-06-19 DIAGNOSIS — Z1211 Encounter for screening for malignant neoplasm of colon: Secondary | ICD-10-CM

## 2013-06-19 MED ORDER — SODIUM CHLORIDE 0.9 % IV SOLN: 500.0000 mL | INTRAVENOUS | Status: DC

## 2013-06-19 NOTE — Op Note (Signed)
[facility]  [address] [place], [id number]   COLONOSCOPY PROCEDURE REPORT  PATIENT: Dominic Coleman, Dominic Coleman  MR#: [id number] BIRTHDATE: May 14, 1961 , 52  yrs. old GENDER: Male ENDOSCOPIST: Sable Feil, MD, [facility] REFERRED BY: PROCEDURE DATE:  06/19/2013 PROCEDURE:   Colonoscopy, screening First Screening Colonoscopy - Avg.  risk and is 50 yrs.  old or older Yes.      History of Adenoma - Now for follow-up colonoscopy & has been > or = to 3 yrs.  N/A ASA CLASS:   Class II INDICATIONS:average risk screening. MEDICATIONS: Propofol (Diprivan) 230 mg IV  DESCRIPTION OF PROCEDURE:   After the risks benefits and alternatives of the procedure were thoroughly explained, informed consent was obtained.  A digital rectal exam revealed no abnormalities of the rectum.   The LB [id number]  endoscope was introduced through the anus and advanced to the cecum, which was identified by both the appendix and ileocecal valve. No adverse events experienced.   The quality of the prep was excellent, using MoviPrep  The instrument was then slowly withdrawn as the colon was fully examined.      COLON FINDINGS: A normal appearing cecum, ileocecal valve, and appendiceal orifice were identified.  The ascending, hepatic flexure, transverse, splenic flexure, descending, sigmoid colon and rectum appeared unremarkable.  No polyps or cancers were seen. Retroflexed views revealed no abnormalities. The time to cecum=5 minutes 07 seconds.  Withdrawal time=6 minutes 12 seconds.  The scope was withdrawn and the procedure completed. COMPLICATIONS: There were no complications.  ENDOSCOPIC IMPRESSION: Normal colon ...no polyps noted....  RECOMMENDATIONS: 1.  Continue current medications 2.  Continue current colorectal screening recommendations for "routine risk" patients with a repeat colonoscopy in 10 years.   eSigned:  Sable Feil, MD, [facility] 06/19/2013 9:32 AM   cc:

## 2013-06-19 NOTE — Patient Instructions (Addendum)
YOU HAD AN ENDOSCOPIC PROCEDURE TODAY AT THE [facility]: Refer to the procedure report that was given to you for any specific questions about what was found during the examination.  If the procedure report does not answer your questions, please call your gastroenterologist to clarify.  If you requested that your care partner not be given the details of your procedure findings, then the procedure report has been included in a sealed envelope for you to review at your convenience later.  YOU SHOULD EXPECT: Some feelings of bloating in the abdomen. Passage of more gas than usual.  Walking can help get rid of the air that was put into your GI tract during the procedure and reduce the bloating. If you had a lower endoscopy (such as a colonoscopy or flexible sigmoidoscopy) you may notice spotting of blood in your stool or on the toilet paper. If you underwent a bowel prep for your procedure, then you may not have a normal bowel movement for a few days.  DIET: Your first meal following the procedure should be a light meal and then it is ok to progress to your normal diet.  A half-sandwich or bowl of soup is an example of a good first meal.  Heavy or fried foods are harder to digest and may make you feel nauseous or bloated.  Likewise meals heavy in dairy and vegetables can cause extra gas to form and this can also increase the bloating.  Drink plenty of fluids but you should avoid alcoholic beverages for 24 hours.  ACTIVITY: Your care partner should take you home directly after the procedure.  You should plan to take it easy, moving slowly for the rest of the day.  You can resume normal activity the day after the procedure however you should NOT DRIVE or use heavy machinery for 24 hours (because of the sedation medicines used during the test).    SYMPTOMS TO REPORT IMMEDIATELY: A gastroenterologist can be reached at any hour.  During normal business hours, 8:30 AM to 5:00 PM Monday through Friday,  call [phone].  After hours and on weekends, please call the GI answering service at [phone] who will take a message and have the physician on call contact you.   Following lower endoscopy (colonoscopy or flexible sigmoidoscopy):  Excessive amounts of blood in the stool  Significant tenderness or worsening of abdominal pains  Swelling of the abdomen that is new, acute  Fever of 100F or higher  FOLLOW UP: If any biopsies were taken you will be contacted by phone or by letter within the next 1-3 weeks.  Call your gastroenterologist if you have not heard about the biopsies in 3 weeks.  Our staff will call the home number listed on your records the next business day following your procedure to check on you and address any questions or concerns that you may have at that time regarding the information given to you following your procedure. This is a courtesy call and so if there is no answer at the home number and we have not heard from you through the emergency physician on call, we will assume that you have returned to your regular daily activities without incident.  SIGNATURES/CONFIDENTIALITY: You and/or your care partner have signed paperwork which will be entered into your electronic medical record.  These signatures attest to the fact that that the information above on your After Visit Summary has been reviewed and is understood.  Full responsibility of the confidentiality of this   discharge information lies with you and/or your care-partner.  Resume medications. 

## 2013-06-19 NOTE — Progress Notes (Signed)
Patient did not experience any of the following events: a burn prior to discharge; a fall within the facility; wrong site/side/patient/procedure/implant event; or a hospital transfer or hospital admission upon discharge from the facility. (G8907) Patient did not have preoperative order for IV antibiotic SSI prophylaxis. (G8918)  

## 2013-06-19 NOTE — Progress Notes (Signed)
Procedure ends, to recovery, report given and VSS. 

## 2013-06-20 ENCOUNTER — Telehealth: Payer: Self-pay | Admitting: *Deleted

## 2013-06-20 NOTE — Telephone Encounter (Signed)
  Follow up Call-  Call back number 06/19/2013  Post procedure Call Back phone  # [phone]  Permission to leave phone message Yes     Patient questions:  Do you have a fever, pain , or abdominal swelling? no Pain Score  0 *  Have you tolerated food without any problems? yes  Have you been able to return to your normal activities? yes  Do you have any questions about your discharge instructions: Diet   no Medications  no Follow up visit  no  Do you have questions or concerns about your Care? no  Actions: * If pain score is 4 or above: No action needed, pain <4.

## 2013-08-31 ENCOUNTER — Other Ambulatory Visit: Payer: Self-pay

## 2014-03-22 ENCOUNTER — Ambulatory Visit: Payer: Managed Care, Other (non HMO) | Admitting: Cardiovascular Disease

## 2014-03-22 ENCOUNTER — Encounter: Payer: Self-pay | Admitting: Cardiovascular Disease

## 2014-03-22 ENCOUNTER — Ambulatory Visit (INDEPENDENT_AMBULATORY_CARE_PROVIDER_SITE_OTHER): Payer: Managed Care, Other (non HMO) | Admitting: Cardiovascular Disease

## 2014-03-22 VITALS — BP 142/76 | HR 66 | Ht 68.0 in | Wt 157.8 lb

## 2014-03-22 DIAGNOSIS — R9431 Abnormal electrocardiogram [ECG] [EKG]: Secondary | ICD-10-CM | POA: Insufficient documentation

## 2014-03-22 NOTE — Assessment & Plan Note (Addendum)
Patient has a history of abnormal EKG. I evaluated him back in 2008. He has septal Q waves, LVH voltage and peaked T waves. His 2-D echo was normal. He is completely asymptomatic and he is a young man without cardiovascular risk factors. I believe this is a "normal EKG  for him and does not need to be further evaluated

## 2014-03-22 NOTE — Patient Instructions (Signed)
Your physician recommends that you schedule a follow-up appointment as needed .No changes have been made today in your therapy.

## 2014-03-22 NOTE — Progress Notes (Signed)
     03/22/2014 Dominic Coleman   [birth year]  [id number]  Primary Physician Scarlette Calico, MD Primary Cardiologist: Lorretta Harp MD Renae Gloss   HPI:  Dominic Coleman is a delightful 53 year old thin and fit appearing single African American male who is retired from the [employer] where he worked for 20 years and now works for the [employer]. I initially saw him back in 2008 as a referral for an abnormal EKG. His EKG was notable for septal Q waves, LVH voltage and peaked T waves. A 2-D echo was normal. He is completely asymptomatic. He has no cardiovascular risk factors. He exercises frequently without symptoms or limitations. He is referred back by the [employer] for cardiovascular clearance because of his EKG which has been previously noted and cleared.   Current Outpatient Prescriptions  Medication Sig Dispense Refill  . Multiple Vitamin (MULTIVITAMIN) tablet Take 1 tablet by mouth daily.         No current facility-administered medications for this visit.    No Known Allergies  History   Social History  . Marital Status: Single    Spouse Name: N/A    Number of Children: N/A  . Years of Education: N/A   Occupational History  . Not on file.   Social History Main Topics  . Smoking status: Never Smoker   . Smokeless tobacco: Never Used  . Alcohol Use: Yes     Comment: 3 drinks containing 0.5 oz of alcholo per month  . Drug Use: No  . Sexual Activity: Yes   Other Topics Concern  . Not on file   Social History Narrative  . No narrative on file     Review of Systems: General: negative for chills, fever, night sweats or weight changes.  Cardiovascular: negative for chest pain, dyspnea on exertion, edema, orthopnea, palpitations, paroxysmal nocturnal dyspnea or shortness of breath Dermatological: negative for rash Respiratory: negative for cough or wheezing Urologic: negative for hematuria Abdominal: negative for nausea, vomiting, diarrhea, bright red blood  per rectum, melena, or hematemesis Neurologic: negative for visual changes, syncope, or dizziness All other systems reviewed and are otherwise negative except as noted above.    Blood pressure 142/76, pulse 66, height 5' 8"  (1.727 m), weight 157 lb 12.8 oz (71.578 kg).  General appearance: alert and no distress Neck: no adenopathy, no carotid bruit, no JVD, supple, symmetrical, trachea midline and thyroid not enlarged, symmetric, no tenderness/mass/nodules Lungs: clear to auscultation bilaterally Heart: regular rate and rhythm, S1, S2 normal, no murmur, click, rub or gallop Extremities: extremities normal, atraumatic, no cyanosis or edema  EKG normal sinus rhythm at 66 with septal Q waves, LVH voltage and peaked T waves unchanged from his prior EKG.  ASSESSMENT AND PLAN:   Abnormal EKG Patient has a history of abnormal EKG. I evaluated him back in 2008. He has septal Q waves, LVH voltage and peaked T waves. His 2-D echo was normal. He is completely astigmatic and effect young man without cardiovascular risk factors. I believe this is a "normal EKG and put for him and does not need to be further evaluated      Lorretta Harp MD [facility], [facility] 03/22/2014 8:14 AM

## 2014-03-23 ENCOUNTER — Ambulatory Visit: Payer: Managed Care, Other (non HMO) | Admitting: Cardiovascular Disease

## 2014-03-23 ENCOUNTER — Telehealth: Payer: Self-pay | Admitting: *Deleted

## 2014-03-23 NOTE — Telephone Encounter (Signed)
Called patient advising him he can come by to get 03/22/14 office progress note to take to [employer] guard clearing him for service. If after reviewed and they need additional information we will be happy to assist him.

## 2014-03-27 ENCOUNTER — Ambulatory Visit: Payer: Managed Care, Other (non HMO) | Admitting: Physician Assistant

## 2015-05-14 ENCOUNTER — Other Ambulatory Visit (INDEPENDENT_AMBULATORY_CARE_PROVIDER_SITE_OTHER): Payer: Managed Care, Other (non HMO)

## 2015-05-14 ENCOUNTER — Ambulatory Visit (INDEPENDENT_AMBULATORY_CARE_PROVIDER_SITE_OTHER): Payer: Managed Care, Other (non HMO) | Admitting: Internal Medicine

## 2015-05-14 ENCOUNTER — Encounter: Payer: Self-pay | Admitting: Internal Medicine

## 2015-05-14 VITALS — BP 118/78 | HR 71 | Temp 98.5°F | Resp 16 | Ht 68.0 in | Wt 152.8 lb

## 2015-05-14 DIAGNOSIS — Z0000 Encounter for general adult medical examination without abnormal findings: Secondary | ICD-10-CM | POA: Diagnosis not present

## 2015-05-14 LAB — CBC WITH DIFFERENTIAL/PLATELET
BASOS PCT: 0.4 % (ref 0.0–3.0)
Basophils Absolute: 0 10*3/uL (ref 0.0–0.1)
EOS PCT: 3.2 % (ref 0.0–5.0)
Eosinophils Absolute: 0.1 10*3/uL (ref 0.0–0.7)
HCT: 41.2 % (ref 39.0–52.0)
HEMOGLOBIN: 13.9 g/dL (ref 13.0–17.0)
Lymphocytes Relative: 32.4 % (ref 12.0–46.0)
Lymphs Abs: 0.9 10*3/uL (ref 0.7–4.0)
MCHC: 33.7 g/dL (ref 30.0–36.0)
MCV: 91.7 fl (ref 78.0–100.0)
Monocytes Absolute: 0.4 10*3/uL (ref 0.1–1.0)
Monocytes Relative: 13.2 % — ABNORMAL HIGH (ref 3.0–12.0)
NEUTROS ABS: 1.4 10*3/uL (ref 1.4–7.7)
NEUTROS PCT: 50.8 % (ref 43.0–77.0)
Platelets: 282 10*3/uL (ref 150.0–400.0)
RBC: 4.49 Mil/uL (ref 4.22–5.81)
RDW: 12.7 % (ref 11.5–15.5)
WBC: 2.8 10*3/uL — ABNORMAL LOW (ref 4.0–10.5)

## 2015-05-14 LAB — COMPREHENSIVE METABOLIC PANEL
ALT: 17 U/L (ref 0–53)
AST: 25 U/L (ref 0–37)
Albumin: 4.2 g/dL (ref 3.5–5.2)
Alkaline Phosphatase: 78 U/L (ref 39–117)
BILIRUBIN TOTAL: 0.5 mg/dL (ref 0.2–1.2)
BUN: 21 mg/dL (ref 6–23)
CO2: 29 mEq/L (ref 19–32)
CREATININE: 0.96 mg/dL (ref 0.40–1.50)
Calcium: 9.5 mg/dL (ref 8.4–10.5)
Chloride: 104 mEq/L (ref 96–112)
GFR: 105.65 mL/min (ref >60.00)
GLUCOSE: 79 mg/dL (ref 70–99)
POTASSIUM: 4.2 meq/L (ref 3.5–5.1)
Sodium: 141 mEq/L (ref 135–145)
TOTAL PROTEIN: 7.1 g/dL (ref 6.0–8.3)

## 2015-05-14 LAB — LIPID PANEL
Cholesterol: 188 mg/dL (ref 0–200)
HDL: 65.1 mg/dL (ref >39.00)
LDL Cholesterol: 115 mg/dL — ABNORMAL HIGH (ref 0–99)
NonHDL: 122.9
Total CHOL/HDL Ratio: 3
Triglycerides: 40 mg/dL (ref 0.0–149.0)
VLDL: 8 mg/dL (ref 0.0–40.0)

## 2015-05-14 LAB — PSA: PSA: 0.69 ng/mL (ref 0.10–4.00)

## 2015-05-14 LAB — TSH: TSH: 1.32 u[IU]/mL (ref 0.35–4.50)

## 2015-05-14 LAB — FECAL OCCULT BLOOD, GUAIAC: FECAL OCCULT BLD: NEGATIVE

## 2015-05-14 NOTE — Progress Notes (Signed)
Pre visit review using our clinic review tool, if applicable. No additional management support is needed unless otherwise documented below in the visit note. 

## 2015-05-14 NOTE — Patient Instructions (Signed)

## 2015-05-14 NOTE — Progress Notes (Signed)
Subjective:  Patient ID: [name], male    DOB: 11-16-1960  Age: 54 y.o. MRN: [id number]  CC: Annual Exam   HPI Brigido Mera presents for a CPX - he feels well and offers no complaints. He runs about 5 miles every other day.  Outpatient Prescriptions Prior to Visit  Medication Sig Dispense Refill  . Multiple Vitamin (MULTIVITAMIN) tablet Take 1 tablet by mouth daily.       No facility-administered medications prior to visit.    ROS Review of Systems  Constitutional: Negative.   HENT: Negative.   Eyes: Negative.   Respiratory: Negative.  Negative for cough, choking, chest tightness, shortness of breath, wheezing and stridor.   Cardiovascular: Negative.  Negative for chest pain, palpitations and leg swelling.  Gastrointestinal: Negative.  Negative for nausea, abdominal pain, diarrhea, constipation and blood in stool.  Endocrine: Negative.   Genitourinary: Negative.  Negative for urgency, frequency, decreased urine volume and difficulty urinating.  Musculoskeletal: Negative.   Skin: Negative.   Allergic/Immunologic: Negative.   Neurological: Negative.   Hematological: Negative.   Psychiatric/Behavioral: Negative.     Objective:  BP 118/78 mmHg  Pulse 71  Temp(Src) 98.5 F (36.9 C) (Oral)  Resp 16  Ht 5' 8"  (1.727 m)  Wt 152 lb 12 oz (69.287 kg)  BMI 23.23 kg/m2  SpO2 99%  BP Readings from Last 3 Encounters:  05/14/15 118/78  03/22/14 142/76  06/19/13 123/78    Wt Readings from Last 3 Encounters:  05/14/15 152 lb 12 oz (69.287 kg)  03/22/14 157 lb 12.8 oz (71.578 kg)  06/19/13 159 lb (72.122 kg)    Physical Exam  Constitutional: He is oriented to person, place, and time. He appears well-developed and well-nourished. No distress.  HENT:  Head: Normocephalic and atraumatic.  Mouth/Throat: Oropharynx is clear and moist. No oropharyngeal exudate.  Eyes: Conjunctivae are normal. Right eye exhibits no discharge. Left eye exhibits no discharge. No scleral  icterus.  Neck: Normal range of motion. Neck supple. No JVD present. No tracheal deviation present. No thyromegaly present.  Cardiovascular: Normal rate, regular rhythm, normal heart sounds and intact distal pulses.  Exam reveals no gallop and no friction rub.   No murmur heard. Pulmonary/Chest: Effort normal and breath sounds normal. No stridor. No respiratory distress. He has no wheezes. He has no rales. He exhibits no tenderness.  Abdominal: Soft. Bowel sounds are normal. He exhibits no distension and no mass. There is no tenderness. There is no rebound and no guarding. Hernia confirmed negative in the right inguinal area and confirmed negative in the left inguinal area.  Genitourinary: Rectum normal, prostate normal, testes normal and penis normal. Rectal exam shows no external hemorrhoid, no internal hemorrhoid, no fissure, no mass and no tenderness. Guaiac negative stool. Prostate is not enlarged and not tender. Right testis shows no mass, no swelling and no tenderness. Right testis is descended. Left testis shows no mass, no swelling and no tenderness. Left testis is descended. Uncircumcised. No phimosis, paraphimosis, hypospadias, penile erythema or penile tenderness. No discharge found.  Musculoskeletal: Normal range of motion. He exhibits no tenderness.  Lymphadenopathy:    He has no cervical adenopathy.       Right: No inguinal adenopathy present.       Left: No inguinal adenopathy present.  Neurological: He is oriented to person, place, and time.  Skin: Skin is warm and dry. No rash noted. He is not diaphoretic. No erythema. No pallor.  Psychiatric: He has a normal mood  and affect. His behavior is normal. Judgment and thought content normal.    Lab Results  Component Value Date   WBC 2.8* 05/14/2015   HGB 13.9 05/14/2015   HCT 41.2 05/14/2015   PLT 282.0 05/14/2015   GLUCOSE 79 05/14/2015   CHOL 188 05/14/2015   TRIG 40.0 05/14/2015   HDL 65.10 05/14/2015   LDLCALC 115*  05/14/2015   ALT 17 05/14/2015   AST 25 05/14/2015   NA 141 05/14/2015   K 4.2 05/14/2015   CL 104 05/14/2015   CREATININE 0.96 05/14/2015   BUN 21 05/14/2015   CO2 29 05/14/2015   TSH 1.32 05/14/2015   PSA 0.69 05/14/2015    No results found.  Assessment & Plan:   Julyan was seen today for annual exam.  Diagnoses and all orders for this visit:  Routine general medical examination at a health care facility- exam done, labs ordered and reviewed, vaccines were reviewed and updated, pt ed material was given. Orders: -     Lipid panel; Future -     Comprehensive metabolic panel; Future -     CBC with Differential/Platelet; Future -     TSH; Future -     PSA; Future   I am having Mr. Frankowski maintain his multivitamin.  No orders of the defined types were placed in this encounter.     Follow-up: Return in about 1 year (around 05/13/2016).  Scarlette Calico, MD

## 2015-12-09 LAB — BASIC METABOLIC PANEL
BUN: 22 mg/dL — AB (ref 4–21)
CREATININE: 0.8 mg/dL (ref 0.6–1.3)
Glucose: 75 mg/dL
POTASSIUM: 4.6 mmol/L (ref 3.4–5.3)
Sodium: 143 mmol/L (ref 137–147)

## 2015-12-09 LAB — HEPATIC FUNCTION PANEL
AST: 31 U/L (ref 14–40)
Alkaline Phosphatase: 76 U/L (ref 25–125)
Bilirubin, Total: 0.3 mg/dL

## 2016-01-22 ENCOUNTER — Encounter: Payer: Self-pay | Admitting: Internal Medicine

## 2016-01-22 ENCOUNTER — Other Ambulatory Visit (INDEPENDENT_AMBULATORY_CARE_PROVIDER_SITE_OTHER): Payer: Managed Care, Other (non HMO)

## 2016-01-22 ENCOUNTER — Ambulatory Visit (INDEPENDENT_AMBULATORY_CARE_PROVIDER_SITE_OTHER): Payer: Managed Care, Other (non HMO) | Admitting: Internal Medicine

## 2016-01-22 VITALS — BP 106/64 | HR 62 | Temp 98.5°F | Resp 16 | Ht 68.0 in | Wt 158.0 lb

## 2016-01-22 DIAGNOSIS — Z0000 Encounter for general adult medical examination without abnormal findings: Secondary | ICD-10-CM | POA: Diagnosis not present

## 2016-01-22 LAB — FECAL OCCULT BLOOD, GUAIAC: FECAL OCCULT BLD: NEGATIVE

## 2016-01-22 LAB — CBC WITH DIFFERENTIAL/PLATELET
BASOS PCT: 0.6 % (ref 0.0–3.0)
Basophils Absolute: 0 10*3/uL (ref 0.0–0.1)
EOS PCT: 4.8 % (ref 0.0–5.0)
Eosinophils Absolute: 0.1 10*3/uL (ref 0.0–0.7)
HEMATOCRIT: 39.4 % (ref 39.0–52.0)
HEMOGLOBIN: 13.4 g/dL (ref 13.0–17.0)
LYMPHS PCT: 28.9 % (ref 12.0–46.0)
Lymphs Abs: 0.9 10*3/uL (ref 0.7–4.0)
MCHC: 34.1 g/dL (ref 30.0–36.0)
MCV: 90.9 fl (ref 78.0–100.0)
MONO ABS: 0.4 10*3/uL (ref 0.1–1.0)
MONOS PCT: 14.6 % — AB (ref 3.0–12.0)
Neutro Abs: 1.5 10*3/uL (ref 1.4–7.7)
Neutrophils Relative %: 51.1 % (ref 43.0–77.0)
Platelets: 325 10*3/uL (ref 150.0–400.0)
RBC: 4.34 Mil/uL (ref 4.22–5.81)
RDW: 13 % (ref 11.5–15.5)
WBC: 3 10*3/uL — AB (ref 4.0–10.5)

## 2016-01-22 LAB — LIPID PANEL
CHOL/HDL RATIO: 3
Cholesterol: 209 mg/dL — ABNORMAL HIGH (ref 0–200)
HDL: 63.9 mg/dL (ref >39.00)
LDL CALC: 134 mg/dL — AB (ref 0–99)
NonHDL: 145.57
Triglycerides: 58 mg/dL (ref 0.0–149.0)
VLDL: 11.6 mg/dL (ref 0.0–40.0)

## 2016-01-22 LAB — PSA: PSA: 1.34 ng/mL (ref 0.10–4.00)

## 2016-01-22 LAB — HEMOGLOBIN A1C: HEMOGLOBIN A1C: 6.1 % (ref 4.6–6.5)

## 2016-01-22 LAB — HEPATITIS C ANTIBODY: HCV Ab: NEGATIVE

## 2016-01-22 LAB — TSH: TSH: 1.11 u[IU]/mL (ref 0.35–4.50)

## 2016-01-22 NOTE — Patient Instructions (Signed)

## 2016-01-22 NOTE — Progress Notes (Signed)
Subjective:  Patient ID: Dominic Coleman, male    DOB: [birth year]  Age: 55 y.o. MRN: [id number]  CC: Annual Exam   HPI Hewitt Garner presents for a CPX - he feels well and has no complaints.  History Mehdi has a past medical history of Anemia; Allergy; History of pneumonia; Positive PPD; Abnormal EKG; and PPD positive (2001).   He has no past surgical history on file.   His family history includes Colon polyps in his sister; Hyperlipidemia in his other; Hypertension in his other. There is no history of Cancer, Diabetes, Early death, Hearing loss, Kidney disease, Stroke, or Alcohol abuse.He reports that he has never smoked. He has never used smokeless tobacco. He reports that he drinks alcohol. He reports that he does not use illicit drugs.  Outpatient Prescriptions Prior to Visit  Medication Sig Dispense Refill  . Multiple Vitamin (MULTIVITAMIN) tablet Take 1 tablet by mouth daily.       No facility-administered medications prior to visit.    ROS Review of Systems  Constitutional: Negative.  Negative for appetite change and unexpected weight change.  HENT: Negative.   Eyes: Negative.   Respiratory: Negative.  Negative for cough, choking, chest tightness, shortness of breath and stridor.   Cardiovascular: Negative.  Negative for chest pain, palpitations and leg swelling.  Gastrointestinal: Negative.  Negative for abdominal pain.  Endocrine: Negative.   Genitourinary: Negative.  Negative for urgency, scrotal swelling, difficulty urinating and testicular pain.  Musculoskeletal: Negative.   Skin: Negative.  Negative for color change and rash.  Allergic/Immunologic: Negative.   Neurological: Negative.  Negative for dizziness.  Hematological: Negative.  Negative for adenopathy. Does not bruise/bleed easily.  Psychiatric/Behavioral: Negative.     Objective:  BP 106/64 mmHg  Pulse 62  Temp(Src) 98.5 F (36.9 C) (Oral)  Resp 16  Ht 5' 8"  (1.727 m)  Wt 158 lb (71.668 kg)  BMI  24.03 kg/m2  SpO2 97%  Physical Exam  Constitutional: He is oriented to person, place, and time. He appears well-developed and well-nourished. No distress.  HENT:  Mouth/Throat: Oropharynx is clear and moist. No oropharyngeal exudate.  Eyes: Conjunctivae are normal. Right eye exhibits no discharge. Left eye exhibits no discharge. No scleral icterus.  Neck: Normal range of motion. Neck supple. No JVD present. No tracheal deviation present. No thyromegaly present.  Cardiovascular: Normal rate, regular rhythm, normal heart sounds and intact distal pulses.  Exam reveals no gallop and no friction rub.   No murmur heard. Pulmonary/Chest: Effort normal and breath sounds normal. No stridor. No respiratory distress. He has no wheezes. He has no rales. He exhibits no tenderness.  Abdominal: Soft. Bowel sounds are normal. He exhibits no distension and no mass. There is no tenderness. There is no rebound and no guarding. Hernia confirmed negative in the right inguinal area and confirmed negative in the left inguinal area.  Genitourinary: Rectum normal, prostate normal, testes normal and penis normal. Rectal exam shows no external hemorrhoid, no internal hemorrhoid, no fissure, no mass, no tenderness and anal tone normal. Guaiac negative stool. Prostate is not enlarged and not tender. Right testis shows no mass, no swelling and no tenderness. Right testis is descended. Left testis shows no mass, no swelling and no tenderness. Left testis is descended. Uncircumcised. No phimosis, paraphimosis, hypospadias, penile erythema or penile tenderness. No discharge found.  Musculoskeletal: Normal range of motion. He exhibits no edema or tenderness.  Lymphadenopathy:    He has no cervical adenopathy.  Right: No inguinal adenopathy present.       Left: No inguinal adenopathy present.  Neurological: He is oriented to person, place, and time.  Skin: Skin is warm. No rash noted. He is not diaphoretic. No erythema. No  pallor.  Psychiatric: He has a normal mood and affect. His behavior is normal. Judgment and thought content normal.  Vitals reviewed.     Assessment & Plan:   Babak was seen today for annual exam.  Diagnoses and all orders for this visit:  Routine general medical examination at a health care facility- He refuses a flu vaccine, exam completed, labs ordered and reviewed, medication education material was given, his colonoscopy is up-to-date. -     CBC with Differential/Platelet; Future -     Lipid panel; Future -     Hemoglobin A1c; Future -     TSH; Future -     PSA; Future -     Hepatitis C antibody; Future -     HIV antibody; Future  I am having Mr. Pignato maintain his multivitamin.  No orders of the defined types were placed in this encounter.     Follow-up: Return if symptoms worsen or fail to improve.  Scarlette Calico, MD

## 2016-01-23 ENCOUNTER — Encounter: Payer: Self-pay | Admitting: Internal Medicine

## 2016-01-23 LAB — HIV ANTIBODY (ROUTINE TESTING W REFLEX): HIV 1&2 Ab, 4th Generation: NONREACTIVE

## 2016-11-20 ENCOUNTER — Other Ambulatory Visit (HOSPITAL_COMMUNITY): Payer: Self-pay | Admitting: Otolaryngology

## 2016-11-20 DIAGNOSIS — R1319 Other dysphagia: Secondary | ICD-10-CM

## 2016-12-01 ENCOUNTER — Ambulatory Visit (HOSPITAL_COMMUNITY)
Admission: RE | Admit: 2016-12-01 | Discharge: 2016-12-01 | Disposition: A | Discharge status: Home / Self Care | Payer: Managed Care, Other (non HMO) | Source: Ambulatory Visit | Attending: Otolaryngology | Admitting: Otolaryngology

## 2016-12-01 DIAGNOSIS — Z8701 Personal history of pneumonia (recurrent): Secondary | ICD-10-CM | POA: Insufficient documentation

## 2016-12-01 DIAGNOSIS — D649 Anemia, unspecified: Secondary | ICD-10-CM | POA: Insufficient documentation

## 2016-12-01 DIAGNOSIS — R1319 Other dysphagia: Secondary | ICD-10-CM

## 2018-04-11 ENCOUNTER — Encounter: Payer: Self-pay | Admitting: Internal Medicine

## 2018-04-11 ENCOUNTER — Ambulatory Visit (INDEPENDENT_AMBULATORY_CARE_PROVIDER_SITE_OTHER): Payer: Managed Care, Other (non HMO) | Admitting: Internal Medicine

## 2018-04-11 ENCOUNTER — Other Ambulatory Visit (INDEPENDENT_AMBULATORY_CARE_PROVIDER_SITE_OTHER): Payer: Managed Care, Other (non HMO)

## 2018-04-11 VITALS — BP 122/84 | HR 65 | Temp 98.6°F | Ht 68.0 in | Wt 155.0 lb

## 2018-04-11 DIAGNOSIS — R21 Rash and other nonspecific skin eruption: Secondary | ICD-10-CM

## 2018-04-11 DIAGNOSIS — Z0000 Encounter for general adult medical examination without abnormal findings: Secondary | ICD-10-CM

## 2018-04-11 DIAGNOSIS — R202 Paresthesia of skin: Secondary | ICD-10-CM | POA: Insufficient documentation

## 2018-04-11 DIAGNOSIS — E785 Hyperlipidemia, unspecified: Secondary | ICD-10-CM | POA: Insufficient documentation

## 2018-04-11 LAB — CBC WITH DIFFERENTIAL/PLATELET
BASOS ABS: 0 10*3/uL (ref 0.0–0.1)
Basophils Relative: 0.8 % (ref 0.0–3.0)
EOS ABS: 0.1 10*3/uL (ref 0.0–0.7)
Eosinophils Relative: 3.4 % (ref 0.0–5.0)
HCT: 39.1 % (ref 39.0–52.0)
Hemoglobin: 13.8 g/dL (ref 13.0–17.0)
Lymphocytes Relative: 33.3 % (ref 12.0–46.0)
Lymphs Abs: 1 10*3/uL (ref 0.7–4.0)
MCHC: 35.2 g/dL (ref 30.0–36.0)
MCV: 92.9 fl (ref 78.0–100.0)
MONOS PCT: 13.6 % — AB (ref 3.0–12.0)
Monocytes Absolute: 0.4 10*3/uL (ref 0.1–1.0)
Neutro Abs: 1.5 10*3/uL (ref 1.4–7.7)
Neutrophils Relative %: 48.9 % (ref 43.0–77.0)
PLATELETS: 277 10*3/uL (ref 150.0–400.0)
RBC: 4.21 Mil/uL — AB (ref 4.22–5.81)
RDW: 12.3 % (ref 11.5–15.5)
WBC: 3.1 10*3/uL — ABNORMAL LOW (ref 4.0–10.5)

## 2018-04-11 LAB — HEPATIC FUNCTION PANEL
ALK PHOS: 70 U/L (ref 39–117)
ALT: 19 U/L (ref 0–53)
AST: 25 U/L (ref 0–37)
Albumin: 4.2 g/dL (ref 3.5–5.2)
BILIRUBIN DIRECT: 0.1 mg/dL (ref 0.0–0.3)
BILIRUBIN TOTAL: 0.5 mg/dL (ref 0.2–1.2)
Total Protein: 7.2 g/dL (ref 6.0–8.3)

## 2018-04-11 LAB — URINALYSIS, ROUTINE W REFLEX MICROSCOPIC
BILIRUBIN URINE: NEGATIVE
HGB URINE DIPSTICK: NEGATIVE
Ketones, ur: NEGATIVE
Leukocytes, UA: NEGATIVE
NITRITE: NEGATIVE
RBC / HPF: NONE SEEN (ref 0–?)
Specific Gravity, Urine: >=1.03 — AB (ref 1.000–1.030)
Total Protein, Urine: NEGATIVE
Urine Glucose: NEGATIVE
Urobilinogen, UA: 0.2 (ref 0.0–1.0)
pH: 6 (ref 5.0–8.0)

## 2018-04-11 LAB — BASIC METABOLIC PANEL
BUN: 22 mg/dL (ref 6–23)
CO2: 29 meq/L (ref 19–32)
CREATININE: 1.03 mg/dL (ref 0.40–1.50)
Calcium: 9.5 mg/dL (ref 8.4–10.5)
Chloride: 104 mEq/L (ref 96–112)
GFR: 96.35 mL/min (ref >60.00)
GLUCOSE: 85 mg/dL (ref 70–99)
Potassium: 3.9 mEq/L (ref 3.5–5.1)
Sodium: 141 mEq/L (ref 135–145)

## 2018-04-11 LAB — LIPID PANEL
CHOL/HDL RATIO: 3
Cholesterol: 197 mg/dL (ref 0–200)
HDL: 72.4 mg/dL (ref >39.00)
LDL CALC: 111 mg/dL — AB (ref 0–99)
NONHDL: 124.24
TRIGLYCERIDES: 64 mg/dL (ref 0.0–149.0)
VLDL: 12.8 mg/dL (ref 0.0–40.0)

## 2018-04-11 LAB — PSA: PSA: 1 ng/mL (ref 0.10–4.00)

## 2018-04-11 LAB — TSH: TSH: 1.36 u[IU]/mL (ref 0.35–4.50)

## 2018-04-11 MED ORDER — TRIAMCINOLONE ACETONIDE 0.1 % EX CREA: 1.0000 "application " | TOPICAL_CREAM | Freq: Two times a day (BID) | CUTANEOUS | 0 refills | Status: AC

## 2018-04-11 NOTE — Patient Instructions (Addendum)
Please take all new medication as prescribed - the cream for the arm as needed  Please continue all other medications as before, and refills have been done if requested.  Please have the pharmacy call with any other refills you may need.  Please continue your efforts at being more active, low cholesterol diet, and weight control.  You are otherwise up to date with prevention measures today.  Please keep your appointments with your specialists as you may have planned  Please go to the LAB in the Basement (turn left off the elevator) for the tests to be done today  You will be contacted by phone if any changes need to be made immediately.  Otherwise, you will receive a letter about your results with an explanation, but please check with MyChart first.  Please remember to sign up for MyChart if you have not done so, as this will be important to you in the future with finding out test results, communicating by private email, and scheduling acute appointments online when needed.  Please return in 1 year for your yearly visit, or sooner if needed, with Lab testing done 3-5 days before

## 2018-04-11 NOTE — Assessment & Plan Note (Signed)
C/w mild dermatitis, for triam cr prn

## 2018-04-11 NOTE — Assessment & Plan Note (Signed)

## 2018-04-11 NOTE — Assessment & Plan Note (Signed)
C/w mild left ulnar neuritis, exam benign, will hold on NCS for now, for tylenol prn

## 2018-04-11 NOTE — Progress Notes (Signed)
Subjective:    Patient ID: Dominic Coleman, male    DOB: 1961-06-02, 57 y.o.   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  Overall doing ok;  Pt denies Chest pain, worsening SOB, DOE, wheezing, orthopnea, PND, worsening LE edema, palpitations, dizziness or syncope.  Pt denies neurological change such as new headache, facial or extremity weakness.  Pt denies polydipsia, polyuria, or low sugar symptoms. Pt states overall good compliance with treatment and medications, good tolerability, and has been trying to follow appropriate diet.  Pt denies worsening depressive symptoms, suicidal ideation or panic. No fever, night sweats, wt loss, loss of appetite, or other constitutional symptoms.  Pt states good ability with ADL's, has low fall risk, home safety reviewed and adequate, no other significant changes in hearing or vision, and regularly active with exercise, mostly home calisthenics daily.  Has a rash to the left arm with itching for several weeks, also has 6 mo LUE numb, tingling beginning at the left elbow with radiation to the shoulder as well as the distal arm, is positional as bending the elbow makes worse, straightening the arm makes better, overall not as bad as the start, only mild LUE numb tingling without pain or weakness. BP Readings from Last 3 Encounters:  04/11/18 122/84  01/22/16 106/64  05/14/15 118/78   Wt Readings from Last 3 Encounters:  04/11/18 155 lb (70.3 kg)  01/22/16 158 lb (71.7 kg)  05/14/15 152 lb 12 oz (69.3 kg)   Past Medical History:  Diagnosis Date  . Abnormal EKG    work up at [facility] in 2008 (+conc. LVH)  . Allergy   . Anemia   . History of pneumonia   . Positive PPD   . PPD positive 2001   No past surgical history on file.  reports that he has never smoked. He has never used smokeless tobacco. He reports that he drinks alcohol.  He reports that he does not use drugs. family history includes Colon polyps in his sister; Hyperlipidemia in his other; Hypertension in his other. No Known Allergies Current Outpatient Medications on File Prior to Visit  Medication Sig Dispense Refill  . Multiple Vitamin (MULTIVITAMIN) tablet Take 1 tablet by mouth daily.       No current facility-administered medications on file prior to visit.    Review of Systems Constitutional: Negative for other unusual diaphoresis, sweats, appetite or weight changes HENT: Negative for other worsening hearing loss, ear pain, facial swelling, mouth sores or neck stiffness.   Eyes: Negative for other worsening pain, redness or other visual disturbance.  Respiratory: Negative for other stridor or swelling Cardiovascular: Negative for other palpitations or other chest pain  Gastrointestinal: Negative for worsening diarrhea or loose stools, blood in stool, distention or other pain Genitourinary: Negative for hematuria, flank pain or other change in urine volume.  Musculoskeletal: Negative for myalgias or other joint swelling.  Skin: Negative for other color change, or other wound or worsening drainage.  Neurological: Negative for other syncope or numbness. Hematological: Negative for other adenopathy or swelling Psychiatric/Behavioral: Negative for hallucinations, other worsening agitation, SI, self-injury, or new decreased concentration All other system neg per pt   Objective:   Physical Exam BP 122/84   Pulse 65   Temp 98.6 F (37 C) (Oral)   Ht 5' 8"  (1.727 m)  Wt 155 lb (70.3 kg)   SpO2 98%   BMI 23.57 kg/m  VS noted, not obese, athletic appearing Constitutional: Pt is oriented to person, place, and time. Appears well-developed and well-nourished, in no significant distress and comfortable Head: Normocephalic and atraumatic  Eyes: Conjunctivae and EOM are normal. Pupils are equal, round, and reactive to light Right Ear: External ear normal  without discharge Left Ear: External ear normal without discharge Nose: Nose without discharge or deformity Mouth/Throat: Oropharynx is without other ulcerations and moist  Neck: Normal range of motion. Neck supple. No JVD present. No tracheal deviation present or significant neck LA or mass Cardiovascular: Normal rate, regular rhythm, normal heart sounds and intact distal pulses.   Pulmonary/Chest: WOB normal and breath sounds without rales or wheezing  Abdominal: Soft. Bowel sounds are normal. NT. No HSM  Musculoskeletal: Normal range of motion. Exhibits no edema Lymphadenopathy: Has no other cervical adenopathy.  Neurological: Pt is alert and oriented to person, place, and time. Pt has normal reflexes. No cranial nerve deficit. Motor grossly intact, Gait intact Skin: Skin is warm and dry. No rash noted or new ulcerations Psychiatric:  Has normal mood and affect. Behavior is normal without agitation No other new complaints  Lab Results  Component Value Date   WBC 3.0 (L) 01/22/2016   HGB 13.4 01/22/2016   HCT 39.4 01/22/2016   PLT 325.0 01/22/2016   GLUCOSE 79 05/14/2015   CHOL 209 (H) 01/22/2016   TRIG 58.0 01/22/2016   HDL 63.90 01/22/2016   LDLCALC 134 (H) 01/22/2016   ALT 17 05/14/2015   AST 31 12/09/2015   NA 143 12/09/2015   K 4.6 12/09/2015   CL 104 05/14/2015   CREATININE 0.8 12/09/2015   BUN 22 (A) 12/09/2015   CO2 29 05/14/2015   TSH 1.11 01/22/2016   PSA 1.34 01/22/2016   HGBA1C 6.1 01/22/2016       Assessment & Plan:

## 2019-02-13 LAB — HEMOGLOBIN A1C: Hemoglobin A1C: 5.7

## 2019-02-13 LAB — LIPID PANEL
Cholesterol: 175 (ref 0–200)
HDL: 69 (ref 35–70)
LDL Cholesterol: 96
Triglycerides: 49 (ref 40–160)

## 2019-02-13 LAB — BASIC METABOLIC PANEL
BUN: 17 (ref 4–21)
CO2: 24 — AB (ref 13–22)
Chloride: 102 (ref 99–108)
Creatinine: 1 (ref 0.6–1.3)
Glucose: 90
Potassium: 4.5 (ref 3.4–5.3)
Sodium: 142 (ref 137–147)

## 2019-02-13 LAB — CBC AND DIFFERENTIAL
HCT: 37 — AB (ref 41–53)
Hemoglobin: 12.3 — AB (ref 13.5–17.5)
Platelets: 269 (ref 150–399)
WBC: 2.9

## 2019-02-13 LAB — CBC: RBC: 3.97 (ref 3.87–5.11)

## 2019-02-13 LAB — COMPREHENSIVE METABOLIC PANEL
Albumin: 4.3 (ref 3.5–5.0)
Globulin: 2.8

## 2019-02-13 LAB — HEPATIC FUNCTION PANEL
ALT: 57 — AB (ref 10–40)
AST: 43 — AB (ref 14–40)
Alkaline Phosphatase: 108 (ref 25–125)
Bilirubin, Total: 0.2

## 2019-09-19 ENCOUNTER — Other Ambulatory Visit: Payer: Self-pay

## 2019-09-19 ENCOUNTER — Ambulatory Visit (INDEPENDENT_AMBULATORY_CARE_PROVIDER_SITE_OTHER): Payer: Managed Care, Other (non HMO) | Admitting: Internal Medicine

## 2019-09-19 ENCOUNTER — Encounter: Payer: Self-pay | Admitting: Internal Medicine

## 2019-09-19 ENCOUNTER — Other Ambulatory Visit (INDEPENDENT_AMBULATORY_CARE_PROVIDER_SITE_OTHER): Payer: Managed Care, Other (non HMO)

## 2019-09-19 VITALS — BP 136/82 | HR 86 | Temp 98.4°F | Resp 16 | Ht 68.0 in | Wt 157.0 lb

## 2019-09-19 DIAGNOSIS — Z0000 Encounter for general adult medical examination without abnormal findings: Secondary | ICD-10-CM

## 2019-09-19 DIAGNOSIS — D539 Nutritional anemia, unspecified: Secondary | ICD-10-CM | POA: Diagnosis not present

## 2019-09-19 DIAGNOSIS — R7989 Other specified abnormal findings of blood chemistry: Secondary | ICD-10-CM

## 2019-09-19 DIAGNOSIS — R1314 Dysphagia, pharyngoesophageal phase: Secondary | ICD-10-CM

## 2019-09-19 DIAGNOSIS — B9681 Helicobacter pylori [H. pylori] as the cause of diseases classified elsewhere: Secondary | ICD-10-CM | POA: Insufficient documentation

## 2019-09-19 DIAGNOSIS — Z800 Family history of malignant neoplasm of digestive organs: Secondary | ICD-10-CM | POA: Insufficient documentation

## 2019-09-19 DIAGNOSIS — Z23 Encounter for immunization: Secondary | ICD-10-CM | POA: Diagnosis not present

## 2019-09-19 DIAGNOSIS — K2101 Gastro-esophageal reflux disease with esophagitis, with bleeding: Secondary | ICD-10-CM

## 2019-09-19 DIAGNOSIS — R195 Other fecal abnormalities: Secondary | ICD-10-CM | POA: Diagnosis not present

## 2019-09-19 LAB — CBC WITH DIFFERENTIAL/PLATELET
Basophils Absolute: 0 10*3/uL (ref 0.0–0.1)
Basophils Relative: 0.7 % (ref 0.0–3.0)
Eosinophils Absolute: 0 10*3/uL (ref 0.0–0.7)
Eosinophils Relative: 1.8 % (ref 0.0–5.0)
HCT: 39.2 % (ref 39.0–52.0)
Hemoglobin: 13.2 g/dL (ref 13.0–17.0)
Lymphocytes Relative: 32.9 % (ref 12.0–46.0)
Lymphs Abs: 0.8 10*3/uL (ref 0.7–4.0)
MCHC: 33.8 g/dL (ref 30.0–36.0)
MCV: 92.9 fl (ref 78.0–100.0)
Monocytes Absolute: 0.4 10*3/uL (ref 0.1–1.0)
Monocytes Relative: 14.7 % — ABNORMAL HIGH (ref 3.0–12.0)
Neutro Abs: 1.3 10*3/uL — ABNORMAL LOW (ref 1.4–7.7)
Neutrophils Relative %: 49.9 % (ref 43.0–77.0)
Platelets: 276 10*3/uL (ref 150.0–400.0)
RBC: 4.22 Mil/uL (ref 4.22–5.81)
RDW: 12.5 % (ref 11.5–15.5)
WBC: 2.6 10*3/uL — ABNORMAL LOW (ref 4.0–10.5)

## 2019-09-19 LAB — IBC PANEL
Iron: 88 ug/dL (ref 42–165)
Saturation Ratios: 31.7 % (ref 20.0–50.0)
Transferrin: 198 mg/dL — ABNORMAL LOW (ref 212.0–360.0)

## 2019-09-19 LAB — FERRITIN: Ferritin: 230.9 ng/mL (ref 22.0–322.0)

## 2019-09-19 LAB — PSA: PSA: 1.2 ng/mL (ref 0.10–4.00)

## 2019-09-19 LAB — VITAMIN B12: Vitamin B-12: >1500 pg/mL — ABNORMAL HIGH (ref 211–911)

## 2019-09-19 LAB — FOLATE: Folate: >24.1 ng/mL (ref >5.9)

## 2019-09-19 MED ORDER — DEXILANT 60 MG PO CPDR: 60.0000 mg | DELAYED_RELEASE_CAPSULE | Freq: Every day | ORAL | 0 refills | Status: DC

## 2019-09-19 NOTE — Patient Instructions (Signed)
Anemia  Anemia is a condition in which you do not have enough red blood cells or hemoglobin. Hemoglobin is a substance in red blood cells that carries oxygen. When you do not have enough red blood cells or hemoglobin (are anemic), your body cannot get enough oxygen and your organs may not work properly. As a result, you may feel very tired or have other problems. What are the causes? Common causes of anemia include:  Excessive bleeding. Anemia can be caused by excessive bleeding inside or outside the body, including bleeding from the intestine or from periods in women.  Poor nutrition.  Long-lasting (chronic) kidney, thyroid, and liver disease.  Bone marrow disorders.  Cancer and treatments for cancer.  HIV (human immunodeficiency virus) and AIDS (acquired immunodeficiency syndrome).  Treatments for HIV and AIDS.  Spleen problems.  Blood disorders.  Infections, medicines, and autoimmune disorders that destroy red blood cells. What are the signs or symptoms? Symptoms of this condition include:  Minor weakness.  Dizziness.  Headache.  Feeling heartbeats that are irregular or faster than normal (palpitations).  Shortness of breath, especially with exercise.  Paleness.  Cold sensitivity.  Indigestion.  Nausea.  Difficulty sleeping.  Difficulty concentrating. Symptoms may occur suddenly or develop slowly. If your anemia is mild, you may not have symptoms. How is this diagnosed? This condition is diagnosed based on:  Blood tests.  Your medical history.  A physical exam.  Bone marrow biopsy. Your health care provider may also check your stool (feces) for blood and may do additional testing to look for the cause of your bleeding. You may also have other tests, including:  Imaging tests, such as a CT scan or MRI.  Endoscopy.  Colonoscopy. How is this treated? Treatment for this condition depends on the cause. If you continue to lose a lot of blood, you may  need to be treated at a hospital. Treatment may include:  Taking supplements of iron, vitamin S31, or folic acid.  Taking a hormone medicine (erythropoietin) that can help to stimulate red blood cell growth.  Having a blood transfusion. This may be needed if you lose a lot of blood.  Making changes to your diet.  Having surgery to remove your spleen. Follow these instructions at home:  Take over-the-counter and prescription medicines only as told by your health care provider.  Take supplements only as told by your health care provider.  Follow any diet instructions that you were given.  Keep all follow-up visits as told by your health care provider. This is important. Contact a health care provider if:  You develop new bleeding anywhere in the body. Get help right away if:  You are very weak.  You are short of breath.  You have pain in your abdomen or chest.  You are dizzy or feel faint.  You have trouble concentrating.  You have bloody or black, tarry stools.  You vomit repeatedly or you vomit up blood. Summary  Anemia is a condition in which you do not have enough red blood cells or enough of a substance in your red blood cells that carries oxygen (hemoglobin).  Symptoms may occur suddenly or develop slowly.  If your anemia is mild, you may not have symptoms.  This condition is diagnosed with blood tests as well as a medical history and physical exam. Other tests may be needed.  Treatment for this condition depends on the cause of the anemia. This information is not intended to replace advice given to you by  your health care provider. Make sure you discuss any questions you have with your health care provider. Document Released: 11/19/2004 Document Revised: 09/24/2017 Document Reviewed: 11/13/2016 Elsevier Patient Education  2020 [employer].

## 2019-09-19 NOTE — Progress Notes (Signed)
Subjective:  Patient ID: Dominic Coleman, male    DOB: Oct 01, 1961  Age: 58 y.o. MRN: [id number]  CC: Annual Exam, Anemia, and Gastroesophageal Reflux  This visit occurred during the SARS-CoV-2 public health emergency.  Safety protocols were in place, including screening questions prior to the visit, additional usage of staff PPE, and extensive cleaning of exam room while observing appropriate contact time as indicated for disinfecting solutions.    HPI Dominic Coleman presents for a CPX.   He had labs done elsewhere about 6 months ago that revealed mild anemia and mildly elevated liver enzymes.  He complains of a 77-monthhistory of feeling like food gets stuck in his esophagus, melena, and heartburn.  He occasionally takes Aleve, he does not drink alcohol or smoke cigarettes.  His brother was recently diagnosed with colon cancer.   Outpatient Medications Prior to Visit  Medication Sig Dispense Refill  . Multiple Vitamin (MULTIVITAMIN) tablet Take 1 tablet by mouth daily.       No facility-administered medications prior to visit.     ROS Review of Systems  Constitutional: Negative for appetite change, diaphoresis, fatigue and unexpected weight change.  HENT: Positive for trouble swallowing. Negative for sinus pressure and voice change.   Eyes: Negative.   Respiratory: Negative.  Negative for cough, chest tightness, shortness of breath and wheezing.   Cardiovascular: Negative for chest pain, palpitations and leg swelling.  Gastrointestinal: Positive for blood in stool. Negative for abdominal pain, constipation, diarrhea, nausea and vomiting.  Endocrine: Negative.   Genitourinary: Negative.  Negative for difficulty urinating, dysuria and penile swelling.  Musculoskeletal: Negative.   Skin: Negative.  Negative for color change and pallor.  Neurological: Negative.  Negative for dizziness, weakness, light-headedness and headaches.  Hematological: Negative for adenopathy. Does not  bruise/bleed easily.  Psychiatric/Behavioral: Negative.     Objective:  BP 136/82 (BP Location: Left Arm, Patient Position: Sitting, Cuff Size: Normal)   Pulse 86   Temp 98.4 F (36.9 C) (Oral)   Resp 16   Ht 5' 8"  (1.727 m)   Wt 157 lb (71.2 kg)   SpO2 99%   BMI 23.87 kg/m   BP Readings from Last 3 Encounters:  09/19/19 136/82  04/11/18 122/84  01/22/16 106/64    Wt Readings from Last 3 Encounters:  09/19/19 157 lb (71.2 kg)  04/11/18 155 lb (70.3 kg)  01/22/16 158 lb (71.7 kg)    Physical Exam Vitals signs reviewed.  Constitutional:      Appearance: Normal appearance.  HENT:     Nose: Nose normal.  Eyes:     General: No scleral icterus.    Conjunctiva/sclera: Conjunctivae normal.  Neck:     Musculoskeletal: Neck supple.  Cardiovascular:     Rate and Rhythm: Normal rate and regular rhythm.     Heart sounds: No murmur.  Pulmonary:     Effort: Pulmonary effort is normal.     Breath sounds: No stridor. No wheezing, rhonchi or rales.  Abdominal:     General: Abdomen is flat. Bowel sounds are normal. There is no distension.     Palpations: Abdomen is soft. There is no hepatomegaly, splenomegaly or mass.     Tenderness: There is no abdominal tenderness.     Hernia: There is no hernia in the left inguinal area or right inguinal area.  Genitourinary:    Pubic Area: No rash.      Penis: Normal and uncircumcised. No phimosis, paraphimosis, erythema, tenderness, discharge, swelling or lesions.  Scrotum/Testes: Normal.        Right: Mass not present.        Left: Mass not present.     Epididymis:     Right: Normal.     Left: Normal. Not inflamed.     Prostate: Normal. Not enlarged, not tender and no nodules present.     Rectum: Guaiac result positive. No mass, tenderness, anal fissure, external hemorrhoid or internal hemorrhoid. Normal anal tone.  Musculoskeletal: Normal range of motion.     Right lower leg: No edema.     Left lower leg: No edema.   Lymphadenopathy:     Cervical: No cervical adenopathy.     Lower Body: No right inguinal adenopathy. No left inguinal adenopathy.  Skin:    General: Skin is warm and dry.     Coloration: Skin is not pale.  Neurological:     General: No focal deficit present.     Mental Status: He is alert.  Psychiatric:        Mood and Affect: Mood normal.        Behavior: Behavior normal.     Lab Results  Component Value Date   WBC 2.6 (L) 09/19/2019   HGB 13.2 09/19/2019   HCT 39.2 09/19/2019   PLT 276.0 09/19/2019   GLUCOSE 85 04/11/2018   CHOL 175 02/13/2019   TRIG 49 02/13/2019   HDL 69 02/13/2019   LDLCALC 96 02/13/2019   ALT 57 (A) 02/13/2019   AST 43 (A) 02/13/2019   NA 142 02/13/2019   K 4.5 02/13/2019   CL 102 02/13/2019   CREATININE 1.0 02/13/2019   BUN 17 02/13/2019   CO2 24 (A) 02/13/2019   TSH 1.36 04/11/2018   PSA 1.20 09/19/2019   HGBA1C 5.7 02/13/2019    Dg Swallowing Func-speech Pathology  Result Date: 12/01/2016 Objective Swallowing Evaluation: Type of Study: MBS-Modified Barium Swallow Study Patient Details Name: Dominic Coleman MRN: [id number] Date of Birth: May 14, 1961 Today's Date: 12/01/2016 Time: SLP Start Time (ACUTE ONLY): 1305-SLP Stop Time (ACUTE ONLY): 1330 SLP Time Calculation (min) (ACUTE ONLY): 25 min Past Medical History: Past Medical History: Diagnosis Date . Abnormal EKG   work up at [employer] in 2008 (+conc. LVH) . Allergy  . Anemia  . History of pneumonia  . Positive PPD  . PPD positive 2001 Past Surgical History: No past surgical history on file. HPI: 58 y.o. male referred by Dr. Ernesto Rutherford, ENT, for [facility].  Pt reports hx of voice changes ("raspy" quality) since November 2017, as well as mild intermittent pressure/pain above sternal notch that he notices when he is straining, lifting weights, or yelling.  He denies difficulty swallowing, heartburn, globus, coughing.   Subjective: pleasant, good historian Assessment / Plan / Recommendation CHL IP CLINICAL  IMPRESSIONS 12/01/2016 Therapy Diagnosis WFL Clinical Impression Pt presents with normal oropharyngeal swallow function marked by adequate mastication, timely swallow response, good hyolaryngeal mobility with adequate bolus clearance through pharynx.  No penetration/aspiration.  Noted mild hypertrophy of cricopharyngeus.  Brief esophageal sweep revealed no barium residue; no difficulty transferring 13 mm barium pill through pharynx and esophagus.   Impact on safety and function No limitations   CHL IP TREATMENT RECOMMENDATION 12/01/2016 Treatment Recommendations No treatment recommended at this time   No flowsheet data found. CHL IP DIET RECOMMENDATION 12/01/2016 SLP Diet Recommendations Regular solids;Thin liquid Liquid Administration via Cup;Straw Medication Administration Whole meds with liquid Compensations -- Postural Changes --   CHL IP OTHER RECOMMENDATIONS 12/01/2016 Recommended Consults --  Oral Care Recommendations Oral care BID Other Recommendations --   CHL IP FOLLOW UP RECOMMENDATIONS 12/01/2016 Follow up Recommendations None   No flowsheet data found.     CHL IP ORAL PHASE 12/01/2016 Oral Phase WFL Oral - Pudding Teaspoon -- Oral - Pudding Cup -- Oral - Honey Teaspoon -- Oral - Honey Cup -- Oral - Nectar Teaspoon -- Oral - Nectar Cup -- Oral - Nectar Straw -- Oral - Thin Teaspoon -- Oral - Thin Cup -- Oral - Thin Straw -- Oral - Puree -- Oral - Mech Soft -- Oral - Regular -- Oral - Multi-Consistency -- Oral - Pill -- Oral Phase - Comment --  CHL IP PHARYNGEAL PHASE 12/01/2016 Pharyngeal Phase WFL Pharyngeal- Pudding Teaspoon -- Pharyngeal -- Pharyngeal- Pudding Cup -- Pharyngeal -- Pharyngeal- Honey Teaspoon -- Pharyngeal -- Pharyngeal- Honey Cup -- Pharyngeal -- Pharyngeal- Nectar Teaspoon -- Pharyngeal -- Pharyngeal- Nectar Cup -- Pharyngeal -- Pharyngeal- Nectar Straw -- Pharyngeal -- Pharyngeal- Thin Teaspoon -- Pharyngeal -- Pharyngeal- Thin Cup -- Pharyngeal -- Pharyngeal- Thin Straw -- Pharyngeal --  Pharyngeal- Puree -- Pharyngeal -- Pharyngeal- Mechanical Soft -- Pharyngeal -- Pharyngeal- Regular -- Pharyngeal -- Pharyngeal- Multi-consistency -- Pharyngeal -- Pharyngeal- Pill -- Pharyngeal -- Pharyngeal Comment --  CHL IP CERVICAL ESOPHAGEAL PHASE 12/01/2016 Cervical Esophageal Phase WFL Pudding Teaspoon -- Pudding Cup -- Honey Teaspoon -- Honey Cup -- Nectar Teaspoon -- Nectar Cup -- Nectar Straw -- Thin Teaspoon -- Thin Cup -- Thin Straw -- Puree -- Mechanical Soft -- Regular -- Multi-consistency -- Pill -- Cervical Esophageal Comment -- CHL IP GO 12/01/2016 Functional Assessment Tool Used clinical judgment Functional Limitations Swallowing Swallow Current Status (W8032) CH Swallow Goal Status (Z2248) [place] Swallow Discharge Status (G5003) [place] Motor Speech Current Status (B0488) (None) Motor Speech Goal Status (Q9169) (None) Motor Speech Goal Status (I5038) (None) Spoken Language Comprehension Current Status (U8280) (None) Spoken Language Comprehension Goal Status (K3491) (None) Spoken Language Comprehension Discharge Status (P9150) (None) Spoken Language Expression Current Status (V6979) (None) Spoken Language Expression Goal Status (Y8016) (None) Spoken Language Expression Discharge Status [phone]) (None) Attention Current Status (M2707) (None) Attention Goal Status (E6754) (None) Attention Discharge Status (G9201) (None) Memory Current Status (E0712) (None) Memory Goal Status (R9758) (None) Memory Discharge Status (I3254) (None) Voice Current Status (D8264) (None) Voice Goal Status (B5830) (None) Voice Discharge Status (N4076) (None) Other Speech-Language Pathology Functional Limitation (K0881) (None) Other Speech-Language Pathology Functional Limitation Goal Status (J0315) (None) Other Speech-Language Pathology Functional Limitation Discharge Status [phone]) (None) Juan Quam Laurice 12/01/2016, 1:48 PM               Assessment & Plan:   Hakan was seen today for annual exam, anemia and gastroesophageal  reflux.  Diagnoses and all orders for this visit:  Routine general medical examination at a health care facility- Exam completed, labs reviewed, vaccines reviewed and updated, patient education was given. -     PSA; Future  Need for shingles vaccine -     Varicella-zoster vaccine IM (Shingrix)  Occult blood in stools -     Ambulatory referral to Gastroenterology  Gastroesophageal reflux disease with esophagitis and hemorrhage- I have asked him to stop taking Aleve and to avoid anti-inflammatories.  I recommended he start taking a PPI.  I am concerned about upper GI pathology so I have asked him to see GI to see if he needs to undergo an upper endoscopy. -     Ambulatory referral to Gastroenterology -     dexlansoprazole ([place])  60 MG capsule; Take 1 capsule (60 mg total) by mouth daily.  Pharyngoesophageal dysphagia -     Ambulatory referral to Gastroenterology  Family history of colon cancer -     Ambulatory referral to Gastroenterology  Deficiency anemia- His H&H are normal now.  I will screen him for vitamin deficiencies. -     CBC with Differential; Future -     IBC panel; Future -     B12; Future -     Hemoglobinopathy evaluation; Future -     Vitamin B1; Future -     Folate; Future -     Ferritin; Future -     Reticulocytes; Future  Elevated LFTs- His LFTs are normal now.  I will screen him for viral hepatitis. -     Hepatitis B surface antigen; Future -     Hepatitis A antibody, total; Future -     Hepatitis B core antibody, total; Future -     Hepatitis B surface antibody; Future -     Hepatitis C antibody; Future   I am having Dominic Coleman start on [place]. I am also having him maintain his multivitamin.  Meds ordered this encounter  Medications  . dexlansoprazole (DEXILANT) 60 MG capsule    Sig: Take 1 capsule (60 mg total) by mouth daily.    Dispense:  70 capsule    Refill:  0     Follow-up: Return in about 2 months (around 11/19/2019).  Scarlette Calico, MD

## 2019-09-21 LAB — HEMOGLOBINOPATHY EVALUATION
HGB C: 0 %
HGB S: 0 %
HGB VARIANT: 0 %
Hemoglobin A2 Quantitation: 2.2 % (ref 1.8–3.2)
Hemoglobin F Quantitation: 0 % (ref 0.0–2.0)
Hgb A: 97.8 % (ref 96.4–98.8)

## 2019-09-22 ENCOUNTER — Encounter: Payer: Self-pay | Admitting: Internal Medicine

## 2019-09-25 ENCOUNTER — Encounter: Payer: Self-pay | Admitting: Gastroenterology

## 2019-09-25 ENCOUNTER — Ambulatory Visit (INDEPENDENT_AMBULATORY_CARE_PROVIDER_SITE_OTHER): Payer: Managed Care, Other (non HMO) | Admitting: Gastroenterology

## 2019-09-25 DIAGNOSIS — K921 Melena: Secondary | ICD-10-CM | POA: Diagnosis not present

## 2019-09-25 DIAGNOSIS — K219 Gastro-esophageal reflux disease without esophagitis: Secondary | ICD-10-CM

## 2019-09-25 DIAGNOSIS — R1310 Dysphagia, unspecified: Secondary | ICD-10-CM

## 2019-09-25 DIAGNOSIS — K2101 Gastro-esophageal reflux disease with esophagitis, with bleeding: Secondary | ICD-10-CM

## 2019-09-25 DIAGNOSIS — Z1159 Encounter for screening for other viral diseases: Secondary | ICD-10-CM

## 2019-09-25 MED ORDER — DEXILANT 60 MG PO CPDR: 60.0000 mg | DELAYED_RELEASE_CAPSULE | Freq: Every day | ORAL | 1 refills | Status: DC

## 2019-09-25 MED ORDER — SUPREP BOWEL PREP KIT 17.5-3.13-1.6 GM/177ML PO SOLN: ORAL | 0 refills | Status: DC

## 2019-09-25 NOTE — Progress Notes (Signed)
Referring Provider: Janith Lima, MD Primary Care Physician:  Janith Lima, MD  Reason for Consultation: Blood in the stool, dysphagia   IMPRESSION:  Heme + stools with Dr. Ronnald Ramp last week Dark stools after using PeptoBismal - ? melena Dysphagia in the setting of reflux Recent anemia with normal iron studies Aleve as needed Abnormal transaminases Normal screening colonoscopy 06/19/2013 Sharlett Iles) Family history of colon cancer (brother at age 56) Family history of gastric cancer (father) Family history of colon polyps (brothers and sister)  EGD and colonoscopy recommended to evaluate for heme + stools with recent anemia, reflux, and dysphagia.   Noted abnormal liver enzymes. Need to document persistent elevation.   PLAN: Continue Dexilant 60 mg daily Reviewed GERD lifestyle modifications EGD with esophageal biopsies Colonoscopy Hepatic function panel (planned by Dr. Ronnald Ramp in January)  Please see the "Patient Instructions" section for addition details about the plan.  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  The history is obtained to the patient and review of his electronic health record.  Labs performed outside of the [facility] health system about 6 months ago revealed mild anemia and mildly elevated liver enzymes.  Upset stomach last week with associated nausea. Used PeptoBismal with some relief. Bowel movements were then black. Malodorous stools - "my stools smelled like I was really sick." Using beet chews around that time, as well.   69-monthhistory of dysphagia to solids. Lodges at the sternal notch. Would have to eat slowly and follow bites with water boluses. Was concerned that he might choke.   He started taking Dexilant 60 mg daily last week. No odynophagia, regurgitation, abdomina pain, sore throat, neck pain, or dysphonia.  No other associated symptoms. No identified  exacerbating or relieving features.   Uses Aleve as needed.   Labs 04/11/2018: Total bilirubin 0.5, AST 25, ALT 19, alk phos 70 Labs 02/13/2019: Hemoglobin 12.3; AST 43, ALT 57, alk phos 108, total bilirubin 0.2 Labs 09/19/2019: Hemoglobin 13.2, RDW 12.5, MCV 92.9, platelets 276, iron 88, ferritin 230, B12 greater than 1500  He had a normal screening colonoscopy with Dr. PSharlett Iles8/25/2014.  His sister is a retired nMarine scientistand she has encouraged him to seek care. Brother with colon cancer diagnosed at age 401 Brother and sister with colon polyps.  Father with a stomach cancer with malodorous stools.  No other known family history of colon cancer or polyps. No family history of uterine/endometrial cancer, pancreatic cancer or gastric/stomach cancer.   Past Medical History:  Diagnosis Date  . [facility]normal [facility]    [facility] [facility]  . Allergy   . Anemia   . History of pneumonia   . Positive PPD   . PPD positive 2001    Past Surgical History:  Procedure Laterality Date  . COLONOSCOPY    . DENTAL SURGERY      Current Outpatient Medications  Medication Sig Dispense Refill  . dexlansoprazole (DEXILANT) 60 MG capsule Take 1 capsule (60 mg total) by mouth daily. 70 capsule 0  . Multiple Vitamin (MULTIVITAMIN) tablet Take 1 tablet by mouth daily.       No current facility-administered medications for this visit.     Allergies as of 09/25/2019  . (No Known Allergies)    Family History  Problem Relation Age of Onset  . Hypertension Other   . Hyperlipidemia Other   .  Colon polyps Sister   . Colon cancer Brother   . Colon polyps Brother   . Cancer Neg Hx   . Diabetes Neg Hx   . Early death Neg Hx   . Hearing loss Neg Hx   . Kidney disease Neg Hx   . Stroke Neg Hx   . Alcohol abuse Neg Hx     Social History   Socioeconomic History  . Marital status: Single    Spouse name: Not on file  . Number of children: Not on file  . Years of education: Not on file   . Highest education level: Not on file  Occupational History  . Not on file  Social Needs  . Financial resource strain: Not on file  . Food insecurity    Worry: Not on file    Inability: Not on file  . Transportation needs    Medical: Not on file    Non-medical: Not on file  Tobacco Use  . Smoking status: Never Smoker  . Smokeless tobacco: Never Used  Substance and Sexual Activity  . Alcohol use: Yes    Comment: 3 drinks containing 0.5 oz of alcholo per month  . Drug use: No  . Sexual activity: Yes  Lifestyle  . Physical activity    Days per week: Not on file    Minutes per session: Not on file  . Stress: Not on file  Relationships  . Social Herbalist on phone: Not on file    Gets together: Not on file    Attends religious service: Not on file    Active member of club or organization: Not on file    Attends meetings of clubs or organizations: Not on file    Relationship status: Not on file  . Intimate partner violence    Fear of current or ex partner: Not on file    Emotionally abused: Not on file    Physically abused: Not on file    Forced sexual activity: Not on file  Other Topics Concern  . Not on file  Social History Narrative  . Not on file    Review of Systems: 12 system ROS is negative except as noted above except for allergies.   Physical Exam: General:   Alert,  well-nourished, pleasant and cooperative in NAD Head:  Normocephalic and atraumatic. Eyes:  Sclera clear, no icterus.   Conjunctiva pink. Ears:  Normal auditory acuity. Nose:  No deformity, discharge,  or lesions. Mouth:  No deformity or lesions.   Neck:  Supple; no masses or thyromegaly. Lungs:  Clear throughout to auscultation.   No wheezes. Heart:  Regular rate and rhythm; no murmurs. Abdomen:  Soft, thin, nontender, nondistended, normal bowel sounds, no rebound or guarding. No hepatosplenomegaly.   Rectal:  Deferred  Msk:  Symmetrical. No boney deformities LAD: No inguinal or  umbilical LAD Extremities:  No clubbing or edema. Neurologic:  Alert and  oriented x4;  grossly nonfocal Skin:  Intact without significant lesions or rashes. Psych:  Alert and cooperative. Normal mood and affect.    Brenin Heidelberger L. Tarri Glenn, MD, MPH 09/25/2019, 1:58 PM

## 2019-09-25 NOTE — Patient Instructions (Addendum)
If you are age 56 or older, your body mass index should be between 23-30. Your Body mass index is 23.81 kg/m. If this is out of the aforementioned range listed, please consider follow up with your Primary Care Provider.  If you are age 40 or younger, your body mass index should be between 19-25. Your Body mass index is 23.81 kg/m. If this is out of the aformentioned range listed, please consider follow up with your Primary Care Provider.    I have recommended a colonoscopy and upper endoscopy for further evaluation of your symptoms and the blood in the stool.   Take your Dexilant daily 30 minutes before meals Avoid any dietary triggers Avoid spicy and acidic foods Limit your intake of coffee, tea, alcohol, and carbonated drinks Work to maintain a healthy weight Keep the head of the bed elevated with blocks if you are having any nighttime symptoms Stay upright for 2 hours after eating Avoid meals and snacks three to four hours before bedtime  Tips for colonoscopy:  - Stay well hydrated for 3-4 days prior to the exam. This reduces nausea and dehydration.  - To prevent skin/hemorrhoid irritation - prior to wiping, put A&Dointment or vaseline on the toilet paper. - Keep a towel or pad on the bed.  - Drink  64oz of clear liquids in the morning of prep day (prior to starting the prep) to be sure that there is enough fluid to flush the colon and stay hydrated!!!! This is in addition to the fluids required for preparation. - Use of a flavored hard candy, such as grape Anise Salvo, can counteract some of the flavor of the prep and may prevent some nausea.   Thank you for entrusting me with your care and for choosing [facility], Dr. Thornton Park

## 2019-09-26 ENCOUNTER — Encounter: Payer: Self-pay | Admitting: Gastroenterology

## 2019-09-26 ENCOUNTER — Other Ambulatory Visit: Payer: Self-pay | Admitting: Internal Medicine

## 2019-09-26 ENCOUNTER — Encounter: Payer: Self-pay | Admitting: Internal Medicine

## 2019-09-26 ENCOUNTER — Ambulatory Visit (INDEPENDENT_AMBULATORY_CARE_PROVIDER_SITE_OTHER): Payer: Managed Care, Other (non HMO)

## 2019-09-26 DIAGNOSIS — Z1159 Encounter for screening for other viral diseases: Secondary | ICD-10-CM

## 2019-09-26 DIAGNOSIS — E519 Thiamine deficiency, unspecified: Secondary | ICD-10-CM

## 2019-09-26 LAB — HEPATITIS C ANTIBODY
Hepatitis C Ab: NONREACTIVE
SIGNAL TO CUT-OFF: 0.02 (ref <1.00)

## 2019-09-26 LAB — HEPATITIS B SURFACE ANTIBODY,QUALITATIVE: Hep B S Ab: REACTIVE — AB

## 2019-09-26 LAB — VITAMIN B1: Vitamin B1 (Thiamine): <6 nmol/L — ABNORMAL LOW (ref 8–30)

## 2019-09-26 LAB — RETICULOCYTES
ABS Retic: 38070 cells/uL (ref 25000–9000)
Retic Ct Pct: 0.9 %

## 2019-09-26 LAB — HEPATITIS B SURFACE ANTIGEN: Hepatitis B Surface Ag: NONREACTIVE

## 2019-09-26 LAB — HEPATITIS A ANTIBODY, TOTAL: Hepatitis A AB,Total: REACTIVE — AB

## 2019-09-26 LAB — HEPATITIS B CORE ANTIBODY, TOTAL: Hep B Core Total Ab: NONREACTIVE

## 2019-09-26 MED ORDER — VITAMIN B-1 100 MG PO TABS: 100.0000 mg | ORAL_TABLET | Freq: Every day | ORAL | 1 refills | Status: DC

## 2019-09-27 LAB — SARS CORONAVIRUS 2 (TAT 6-24 HRS): SARS Coronavirus 2: NEGATIVE

## 2019-09-28 ENCOUNTER — Other Ambulatory Visit: Payer: Self-pay

## 2019-09-28 ENCOUNTER — Encounter: Payer: Self-pay | Admitting: Gastroenterology

## 2019-09-28 ENCOUNTER — Other Ambulatory Visit: Payer: Self-pay | Admitting: Gastroenterology

## 2019-09-28 ENCOUNTER — Ambulatory Visit (AMBULATORY_SURGERY_CENTER): Payer: Managed Care, Other (non HMO) | Admitting: Gastroenterology

## 2019-09-28 ENCOUNTER — Encounter: Payer: Managed Care, Other (non HMO) | Admitting: Gastroenterology

## 2019-09-28 VITALS — BP 113/67 | HR 55 | Temp 98.7°F | Resp 21 | Ht 68.0 in | Wt 156.0 lb

## 2019-09-28 DIAGNOSIS — K5289 Other specified noninfective gastroenteritis and colitis: Secondary | ICD-10-CM | POA: Diagnosis not present

## 2019-09-28 DIAGNOSIS — B9681 Helicobacter pylori [H. pylori] as the cause of diseases classified elsewhere: Secondary | ICD-10-CM | POA: Diagnosis not present

## 2019-09-28 DIAGNOSIS — K635 Polyp of colon: Secondary | ICD-10-CM

## 2019-09-28 DIAGNOSIS — K2950 Unspecified chronic gastritis without bleeding: Secondary | ICD-10-CM | POA: Diagnosis not present

## 2019-09-28 DIAGNOSIS — R195 Other fecal abnormalities: Secondary | ICD-10-CM

## 2019-09-28 DIAGNOSIS — K921 Melena: Secondary | ICD-10-CM

## 2019-09-28 DIAGNOSIS — D124 Benign neoplasm of descending colon: Secondary | ICD-10-CM

## 2019-09-28 LAB — HM COLONOSCOPY

## 2019-09-28 MED ORDER — SODIUM CHLORIDE 0.9 % IV SOLN: 500.0000 mL | Freq: Once | INTRAVENOUS | Status: DC

## 2019-09-28 NOTE — Op Note (Signed)
[facility] Patient Name: Dominic Coleman Procedure Date: 09/28/2019 7:25 AM MRN: [id number] Endoscopist: Thornton Park MD, MD Age: 58 Referring MD:  Date of Birth: November 29, 1960 Gender: Male Account #: [id number] Procedure:                Colonoscopy Indications:              Heme + stools with Dr. Ronnald Ramp last week                           Dark stools after using PeptoBismal - ? melena                           Recent anemia with normal iron studies                           Aleve as needed                           Normal screening colonoscopy 06/19/2013 [facility])                           Family history of colon cancer (brother at age 43)                           Family history of gastric cancer (father)                           Family history of colon polyps (brothers and sister) Medicines:                Monitored Anesthesia Care Procedure:                Pre-Anesthesia Assessment:                           - Prior to the procedure, a History and Physical                            was performed, and patient medications and                            allergies were reviewed. The patient's tolerance of                            previous anesthesia was also reviewed. The risks                            and benefits of the procedure and the sedation                            options and risks were discussed with the patient.                            All questions were answered, and informed consent  was obtained. Prior Anticoagulants: The patient has                            taken no previous anticoagulant or antiplatelet                            agents. ASA Grade Assessment: I - A normal, healthy                            patient. After reviewing the risks and benefits,                            the patient was deemed in satisfactory condition to                            undergo the procedure.                           After  obtaining informed consent, the colonoscope                            was passed under direct vision. Throughout the                            procedure, the patient's blood pressure, pulse, and                            oxygen saturations were monitored continuously. The                            Colonoscope was introduced through the anus and                            advanced to the the terminal ileum, with                            identification of the appendiceal orifice and IC                            valve. A second forward view of the right colon was                            performed. The colonoscopy was performed with                            moderate difficulty due to a redundant colon.                            Successful completion of the procedure was aided by                            applying abdominal pressure. The patient tolerated  the procedure well. The quality of the bowel                            preparation was good. The terminal ileum, ileocecal                            valve, appendiceal orifice, and rectum were                            photographed. Scope In: 8:16:17 AM Scope Out: 8:34:38 AM Scope Withdrawal Time: 0 hours 14 minutes 32 seconds  Total Procedure Duration: 0 hours 18 minutes 21 seconds  Findings:                 The perianal and digital rectal examinations were                            normal.                           A 2 mm polyp was found in the descending colon. The                            polyp was sessile. The polyp was removed with a                            cold snare. An area of surrounding mucosa was                            included in the specimen. Resection and retrieval                            were complete. Estimated blood loss was minimal.                           The colon (entire examined portion) appeared                            normal. Biopsies were taken from the  right colon,                            transverse, left colon, and recturm. with a cold                            forceps for histology. Estimated blood loss was                            minimal.                           The distal ileum contained multiple diffuse                            non-bleeding aphthae and diffuse erythema.  Approximately 10 cm of ileum was evaluated. The                            findings were most pronounced distally. No stigmata                            of recent bleeding were seen. This was biopsied                            with a cold jumbo forceps for histology. Estimated                            blood loss was minimal.                           The exam was otherwise without abnormality on                            direct and retroflexion views. Complications:            No immediate complications. Estimated blood loss:                            Minimal. Estimated Blood Loss:     Estimated blood loss was minimal. Impression:               - One 2 mm polyp in the descending colon, removed                            with a cold snare. Resected and retrieved.                           - The entire examined colon is normal. Biopsied.                           - Terminal ileitis. Biopsied. This explains the                            blood in the stool and may be due to Aleve.                           - The examination was otherwise normal on direct                            and retroflexion views. Recommendation:           - Patient has a contact number available for                            emergencies. The signs and symptoms of potential                            delayed complications were discussed with the  patient. Return to normal activities tomorrow.                            Written discharge instructions were provided to the                            patient.                            - Resume previous diet today.                           - Continue present medications.                           - Stop using all NSAIDs - including Aleve.                           - Await pathology results.                           - Repeat colonoscopy in 5 years for surveillance                            regardless of pathology results given the family                            history.                           - Follow-up in the GI Office in 4-6 weeks. Thornton Park MD, MD 09/28/2019 8:44:16 AM This report has been signed electronically.

## 2019-09-28 NOTE — Progress Notes (Signed)
A/ox3, pleased with MAC, report to RN 

## 2019-09-28 NOTE — Progress Notes (Signed)
Pt's states no medical or surgical changes since previsit or office visit. 

## 2019-09-28 NOTE — Patient Instructions (Signed)
YOU HAD AN ENDOSCOPIC PROCEDURE TODAY AT [facility]:   Refer to the procedure report that was given to you for any specific questions about what was found during the examination.  If the procedure report does not answer your questions, please call your gastroenterologist to clarify.  If you requested that your care partner not be given the details of your procedure findings, then the procedure report has been included in a sealed envelope for you to review at your convenience later.  YOU SHOULD EXPECT: Some feelings of bloating in the abdomen. Passage of more gas than usual.  Walking can help get rid of the air that was put into your GI tract during the procedure and reduce the bloating. If you had a lower endoscopy (such as a colonoscopy or flexible sigmoidoscopy) you may notice spotting of blood in your stool or on the toilet paper. If you underwent a bowel prep for your procedure, you may not have a normal bowel movement for a few days.  Please Note:  You might notice some irritation and congestion in your nose or some drainage.  This is from the oxygen used during your procedure.  There is no need for concern and it should clear up in a day or so.  SYMPTOMS TO REPORT IMMEDIATELY:   Following lower endoscopy (colonoscopy or flexible sigmoidoscopy):  Excessive amounts of blood in the stool  Significant tenderness or worsening of abdominal pains  Swelling of the abdomen that is new, acute  Fever of 100F or higher   Following upper endoscopy (EGD)  Vomiting of blood or coffee ground material  New chest pain or pain under the shoulder blades  Painful or persistently difficult swallowing  New shortness of breath  Fever of 100F or higher  Black, tarry-looking stools  For urgent or emergent issues, a gastroenterologist can be reached at any hour by calling [phone].   DIET:  We do recommend a small meal at first, but then you may proceed to your regular diet.  Drink  plenty of fluids but you should avoid alcoholic beverages for 24 hours.  ACTIVITY:  You should plan to take it easy for the rest of today and you should NOT DRIVE or use heavy machinery until tomorrow (because of the sedation medicines used during the test).    FOLLOW UP: Our staff will call the number listed on your records 48-72 hours following your procedure to check on you and address any questions or concerns that you may have regarding the information given to you following your procedure. If we do not reach you, we will leave a message.  We will attempt to reach you two times.  During this call, we will ask if you have developed any symptoms of COVID 19. If you develop any symptoms (ie: fever, flu-like symptoms, shortness of breath, cough etc.) before then, please call [phone].  If you test positive for Covid 19 in the 2 weeks post procedure, please call and report this information to [place].    If any biopsies were taken you will be contacted by phone or by letter within the next 1-3 weeks.  Please call us at [phone] if you have not heard about the biopsies in 3 weeks.    SIGNATURES/CONFIDENTIALITY: You and/or your care partner have signed paperwork which will be entered into your electronic medical record.  These signatures attest to the fact that that the information above on your After Visit Summary has been reviewed and is  understood.  Full responsibility of the confidentiality of this discharge information lies with you and/or your care-partner.  Await pathology  Avoid all NSAIDS- Aleve, Advil, Motrin, Ibuprofen  Dr. Tarri Glenn' office will call you to set up a follow up appointment for 4-6- weeks in the office  Please read over handouts about polyps and gastritis

## 2019-09-28 NOTE — Op Note (Signed)
[facility] Patient Name: Dominic Coleman Procedure Date: 09/28/2019 7:25 AM MRN: [id number] Endoscopist: Thornton Park MD, MD Age: 58 Referring MD:  Date of Birth: [birth year] Gender: Male Account #: [id number] Procedure:                Upper GI endoscopy Indications:              Heme + stools                           Dark stools after using PeptoBismal - ? melena                           Dysphagia in the setting of reflux                           Recent anemia with normal iron studies                           Aleve as needed                           Abnormal transaminases                           Normal screening colonoscopy 06/19/2013 [facility])                           Family history of colon cancer (brother at age 18)                           Family history of gastric cancer (father)                           Family history of colon polyps (brothers and sister) Medicines:                Monitored Anesthesia Care Procedure:                Pre-Anesthesia Assessment:                           - Prior to the procedure, a History and Physical                            was performed, and patient medications and                            allergies were reviewed. The patient's tolerance of                            previous anesthesia was also reviewed. The risks                            and benefits of the procedure and the sedation                            options and risks were discussed with the patient.  All questions were answered, and informed consent                            was obtained. Prior Anticoagulants: The patient has                            taken no previous anticoagulant or antiplatelet                            agents. ASA Grade Assessment: I - A normal, healthy                            patient. After reviewing the risks and benefits,                            the patient was deemed in satisfactory condition  to                            undergo the procedure.                           After obtaining informed consent, the endoscope was                            passed under direct vision. Throughout the                            procedure, the patient's blood pressure, pulse, and                            oxygen saturations were monitored continuously. The                            Endoscope was introduced through the mouth, and                            advanced to the third part of duodenum. The upper                            GI endoscopy was accomplished without difficulty.                            The patient tolerated the procedure well. Scope In: Scope Out: Findings:                 The examined esophagus was normal. Biopsies were                            obtained from the proximal and distal esophagus                            with cold forceps for histology of suspected  eosinophilic esophagitis given the history of                            dysphagia.                           Patchy mild inflammation characterized by erythema,                            friability and granularity was found in the gastric                            body. Biopsies were taken from the antrum, body,                            and fundus with a cold forceps for histology.                            Estimated blood loss was minimal.                           The examined duodenum was normal.                           The cardia and gastric fundus were normal on                            retroflexion.                           The exam was otherwise without abnormality. Complications:            No immediate complications. Estimated blood loss:                            Minimal. Estimated Blood Loss:     Estimated blood loss was minimal. Impression:               - Normal esophagus. Biopsied.                           - Gastritis. Biopsied.                            - Normal examined duodenum.                           - The examination was otherwise normal. Recommendation:           - Patient has a contact number available for                            emergencies. The signs and symptoms of potential                            delayed complications were discussed with the  patient. Return to normal activities tomorrow.                            Written discharge instructions were provided to the                            patient.                           - Resume previous diet.                           - Continue present medications.                           - Await pathology results.                           - Proceed with colonoscopy as previously planned. Thornton Park MD, MD 09/28/2019 8:37:18 AM This report has been signed electronically.

## 2019-09-28 NOTE — Progress Notes (Signed)
Called to room to assist during endoscopic procedure.  Patient ID and intended procedure confirmed with present staff. Received instructions for my participation in the procedure from the performing physician.  

## 2019-09-29 ENCOUNTER — Encounter: Payer: Self-pay | Admitting: *Deleted

## 2019-10-02 ENCOUNTER — Telehealth: Payer: Self-pay

## 2019-10-02 NOTE — Telephone Encounter (Signed)
  Follow up Call-  Call back number 09/28/2019  Post procedure Call Back phone  # [phone]  Permission to leave phone message Yes  Some recent data might be hidden     Patient questions:  Do you have a fever, pain , or abdominal swelling? No. Pain Score  0 *  Have you tolerated food without any problems? Yes.    Have you been able to return to your normal activities? Yes.    Do you have any questions about your discharge instructions: Diet   No. Medications  No. Follow up visit  No.  Do you have questions or concerns about your Care? No.  Actions: * If pain score is 4 or above: 1. No action needed, pain <4.Have you developed a fever since your procedure? no  2.   Have you had an respiratory symptoms (SOB or cough) since your procedure? no  3.   Have you tested positive for COVID 19 since your procedure no  4.   Have you had any family members/close contacts diagnosed with the COVID 19 since your procedure?  no   If yes to any of these questions please route to Joylene John, RN and Alphonsa Gin, Therapist, sports.

## 2019-10-09 ENCOUNTER — Other Ambulatory Visit: Payer: Self-pay | Admitting: *Deleted

## 2019-10-09 ENCOUNTER — Encounter: Payer: Self-pay | Admitting: *Deleted

## 2019-10-09 DIAGNOSIS — A048 Other specified bacterial intestinal infections: Secondary | ICD-10-CM

## 2019-10-09 MED ORDER — AMOXICILLIN 500 MG PO TABS: 1000.0000 mg | ORAL_TABLET | Freq: Two times a day (BID) | ORAL | 0 refills | Status: AC

## 2019-10-09 MED ORDER — CLARITHROMYCIN 500 MG PO TABS: 500.0000 mg | ORAL_TABLET | Freq: Two times a day (BID) | ORAL | 0 refills | Status: AC

## 2019-11-09 ENCOUNTER — Encounter: Payer: Self-pay | Admitting: Gastroenterology

## 2019-11-09 ENCOUNTER — Ambulatory Visit (INDEPENDENT_AMBULATORY_CARE_PROVIDER_SITE_OTHER): Payer: Managed Care, Other (non HMO) | Admitting: Gastroenterology

## 2019-11-09 VITALS — BP 112/70 | HR 60 | Temp 97.8°F | Ht 68.0 in | Wt 155.5 lb

## 2019-11-09 DIAGNOSIS — K5000 Crohn's disease of small intestine without complications: Secondary | ICD-10-CM

## 2019-11-09 DIAGNOSIS — A048 Other specified bacterial intestinal infections: Secondary | ICD-10-CM | POA: Diagnosis not present

## 2019-11-09 NOTE — Progress Notes (Signed)
Referring Provider: Janith Lima, MD Primary Care Physician:  Janith Lima, MD  Chief complaint: Blood in the stool, dysphagia, nausea   IMPRESSION:  H pylori gastritis    - completed 10 days of amoxicilin, clarithromycin, and Dexilant Terminal ileitis    - likely due to NSAIDs    - random colon biopsies were negative  Dysphagia in the setting of reflux, now resolved Recent anemia with normal iron studies Previously using Aleve as needed Abnormal transaminases Hyperplastic polyp on colonoscopy 09/28/19 Normal screening colonoscopy 06/19/2013 [facility]) Family history of colon cancer (brother at age 39) Family history of gastric cancer (father) Family history of colon polyps (brothers and sister)  Completed H pylori treatment. Will plan stool antigen testing to document resolution after a PPI taper.   Recently abnormal liver enzymes. Need to document persistent elevation. Plan additional follow-up and serologic evaluation if elevated.  Terminal ileitis is likely due to NSAIDs. No s/sx to suggest Crohn's. Will plan further evaluation with CTE, CRP, ESR, and fecal calprotectin with any new symptoms.   Despite the hyperplastic polyp, he should have a colonoscopy in 5 years given his family history.     PLAN: Attempt a Dexilant taper if he remains asymptomatic  Dexilant 60 mg QOD x 2 weeks, then discontinue  Avoid all NSAIDs Reviewed GERD lifestyle modifications Stool antigen for H pylori March 2021 (after completing PPI therapy) Colonoscopy in 2025  Please see the "Patient Instructions" section for addition details about the plan.  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 He returns after initial consultation and endoscopy.  The interval history is obtained to the patient and review of his electronic health record. Labs performed outside of the [facility] health system last years showed mild anemia and mildly  elevated liver enzymes.  At the time of consultation he was having dyspepsia, dysphagia to solids, nausea, and black, malodorous bowel movements. On Dexilant. Used PeptoBismal and Aleve PRN.     Labs 04/11/2018: Total bilirubin 0.5, AST 25, ALT 19, alk phos 70 Labs 02/13/2019: Hemoglobin 12.3; AST 43, ALT 57, alk phos 108, total bilirubin 0.2 Labs 09/19/2019: Hemoglobin 13.2, RDW 12.5, MCV 92.9, platelets 276, iron 88, ferritin 230, B12 greater than 1500  Endoscopic history: Screening colonoscopy 06/19/13 [facility]): normal EGD 09/28/19: H pylori gastritis Colonoscopy 09/28/19: 58m descending hyperplastic polyp. Normal colon biopsies. The distal ileum contained multiple diffuse non-bleeding aphthae and diffuse erythema. Approximately 10 cm of ileum was evaluated. The findings were most pronounced distally.  Biopsy results show H pylori and reflux.  He completed 10 days of amoxicilin, clarithromycin, and Dexilant.   Symptoms have resolved. He is feeling better. GI ROS is negative.   Past Medical History:  Diagnosis Date  . Abnormal EKG    work up at [facility] 2008 (+conc. LVH)  . Allergy   . Anemia   . History of pneumonia   . Positive PPD   . PPD positive 2001    Past Surgical History:  Procedure Laterality Date  . COLONOSCOPY    . DENTAL SURGERY      Current Outpatient Medications  Medication Sig Dispense Refill  . dexlansoprazole (DEXILANT) 60 MG capsule Take 1 capsule (60 mg total) by mouth daily. 30 minutes before a meal 90 capsule 1  . Multiple Vitamin (MULTIVITAMIN) tablet Take 1 tablet by mouth daily.      .Marland Kitchenthiamine (VITAMIN B-1) 100 MG tablet Take 1 tablet (  100 mg total) by mouth daily. 90 tablet 1   No current facility-administered medications for this visit.    Allergies as of 11/09/2019  . (No Known Allergies)    Family History  Problem Relation Age of Onset  . Hypertension Other   . Hyperlipidemia Other   . Colon polyps Sister   . Colon cancer  Brother   . Colon polyps Brother   . Cancer Neg Hx   . Diabetes Neg Hx   . Early death Neg Hx   . Hearing loss Neg Hx   . Kidney disease Neg Hx   . Stroke Neg Hx   . Alcohol abuse Neg Hx     Social History   Socioeconomic History  . Marital status: Single    Spouse name: Not on file  . Number of children: Not on file  . Years of education: Not on file  . Highest education level: Not on file  Occupational History  . Not on file  Tobacco Use  . Smoking status: Never Smoker  . Smokeless tobacco: Never Used  Substance and Sexual Activity  . Alcohol use: Yes    Comment: 3 drinks containing 0.5 oz of alcholo per month  . Drug use: No  . Sexual activity: Yes  Other Topics Concern  . Not on file  Social History Narrative  . Not on file   Social Determinants of Health   Financial Resource Strain:   . Difficulty of Paying Living Expenses: Not on file  Food Insecurity:   . Worried About Charity fundraiser in the Last Year: Not on file  . Ran Out of Food in the Last Year: Not on file  Transportation Needs:   . Lack of Transportation (Medical): Not on file  . Lack of Transportation (Non-Medical): Not on file  Physical Activity:   . Days of Exercise per Week: Not on file  . Minutes of Exercise per Session: Not on file  Stress:   . Feeling of Stress : Not on file  Social Connections:   . Frequency of Communication with Friends and Family: Not on file  . Frequency of Social Gatherings with Friends and Family: Not on file  . Attends Religious Services: Not on file  . Active Member of Clubs or Organizations: Not on file  . Attends Archivist Meetings: Not on file  . Marital Status: Not on file  Intimate Partner Violence:   . Fear of Current or Ex-Partner: Not on file  . Emotionally Abused: Not on file  . Physically Abused: Not on file  . Sexually Abused: Not on file    Physical Exam: General:   Alert,  well-nourished, pleasant and cooperative in NAD Abdomen:   Soft, thin, nontender, nondistended, normal bowel sounds, no rebound or guarding. No hepatosplenomegaly.   Neurologic:  Alert and  oriented x4;  grossly nonfocal Skin:  Intact without significant lesions or rashes. Psych:  Alert and cooperative. Normal mood and affect.    Wendi Lastra L. Tarri Glenn, MD, MPH 11/09/2019, 11:01 AM

## 2019-11-09 NOTE — Patient Instructions (Addendum)
Continue your Dexilant. I recommend reducing your dose to every other day for 2 weeks. If you continue to feel well, you may then discontinue the Dexilant all together.  You should have a stool test for H pylori two months after you have been off of the [place] so that we can prove that we the infection has been successfully treated.  Given your family history, you should have another colonoscopy in 2025.   Please call with any questions or concerns.   If you are age 57 or older, your body mass index should be between 23-30. Your Body mass index is 23.64 kg/m. If this is out of the aforementioned range listed, please consider follow up with your Primary Care Provider.  If you are age 91 or younger, your body mass index should be between 19-25. Your Body mass index is 23.64 kg/m. If this is out of the aformentioned range listed, please consider follow up with your Primary Care Provider.

## 2019-11-15 ENCOUNTER — Ambulatory Visit (INDEPENDENT_AMBULATORY_CARE_PROVIDER_SITE_OTHER): Payer: Managed Care, Other (non HMO) | Admitting: Internal Medicine

## 2019-11-15 ENCOUNTER — Encounter: Payer: Self-pay | Admitting: Internal Medicine

## 2019-11-15 ENCOUNTER — Other Ambulatory Visit: Payer: Self-pay

## 2019-11-15 VITALS — BP 130/78 | HR 64 | Temp 98.2°F | Resp 16 | Ht 68.0 in | Wt 155.0 lb

## 2019-11-15 DIAGNOSIS — E519 Thiamine deficiency, unspecified: Secondary | ICD-10-CM

## 2019-11-15 DIAGNOSIS — R7989 Other specified abnormal findings of blood chemistry: Secondary | ICD-10-CM | POA: Diagnosis not present

## 2019-11-15 DIAGNOSIS — E785 Hyperlipidemia, unspecified: Secondary | ICD-10-CM

## 2019-11-15 DIAGNOSIS — B9681 Helicobacter pylori [H. pylori] as the cause of diseases classified elsewhere: Secondary | ICD-10-CM

## 2019-11-15 DIAGNOSIS — Z23 Encounter for immunization: Secondary | ICD-10-CM

## 2019-11-15 DIAGNOSIS — K2970 Gastritis, unspecified, without bleeding: Secondary | ICD-10-CM

## 2019-11-15 LAB — HEPATIC FUNCTION PANEL
ALT: 32 U/L (ref 0–53)
AST: 32 U/L (ref 0–37)
Albumin: 4.1 g/dL (ref 3.5–5.2)
Alkaline Phosphatase: 89 U/L (ref 39–117)
Bilirubin, Direct: 0.1 mg/dL (ref 0.0–0.3)
Total Bilirubin: 0.4 mg/dL (ref 0.2–1.2)
Total Protein: 7.2 g/dL (ref 6.0–8.3)

## 2019-11-15 LAB — PROTIME-INR
INR: 1.2 ratio — ABNORMAL HIGH (ref 0.8–1.0)
Prothrombin Time: 13.8 s — ABNORMAL HIGH (ref 9.6–13.1)

## 2019-11-15 NOTE — Patient Instructions (Signed)
Gastroesophageal Reflux Disease, Adult Gastroesophageal reflux (GER) happens when acid from the stomach flows up into the tube that connects the mouth and the stomach (esophagus). Normally, food travels down the esophagus and stays in the stomach to be digested. However, when a person has GER, food and stomach acid sometimes move back up into the esophagus. If this becomes a more serious problem, the person may be diagnosed with a disease called gastroesophageal reflux disease (GERD). GERD occurs when the reflux:  Happens often.  Causes frequent or severe symptoms.  Causes problems such as damage to the esophagus. When stomach acid comes in contact with the esophagus, the acid may cause soreness (inflammation) in the esophagus. Over time, GERD may create small holes (ulcers) in the lining of the esophagus. What are the causes? This condition is caused by a problem with the muscle between the esophagus and the stomach (lower esophageal sphincter, or LES). Normally, the LES muscle closes after food passes through the esophagus to the stomach. When the LES is weakened or abnormal, it does not close properly, and that allows food and stomach acid to go back up into the esophagus. The LES can be weakened by certain dietary substances, medicines, and medical conditions, including:  Tobacco use.  Pregnancy.  Having a hiatal hernia.  Alcohol use.  Certain foods and beverages, such as coffee, chocolate, onions, and peppermint. What increases the risk? You are more likely to develop this condition if you:  Have an increased body weight.  Have a connective tissue disorder.  Use NSAID medicines. What are the signs or symptoms? Symptoms of this condition include:  Heartburn.  Difficult or painful swallowing.  The feeling of having a lump in the throat.  Abitter taste in the mouth.  Bad breath.  Having a large amount of saliva.  Having an upset or bloated  stomach.  Belching.  Chest pain. Different conditions can cause chest pain. Make sure you see your health care provider if you experience chest pain.  Shortness of breath or wheezing.  Ongoing (chronic) cough or a night-time cough.  Wearing away of tooth enamel.  Weight loss. How is this diagnosed? Your health care provider will take a medical history and perform a physical exam. To determine if you have mild or severe GERD, your health care provider may also monitor how you respond to treatment. You may also have tests, including:  A test to examine your stomach and esophagus with a small camera (endoscopy).  A test thatmeasures the acidity level in your esophagus.  A test thatmeasures how much pressure is on your esophagus.  A barium swallow or modified barium swallow test to show the shape, size, and functioning of your esophagus. How is this treated? The goal of treatment is to help relieve your symptoms and to prevent complications. Treatment for this condition may vary depending on how severe your symptoms are. Your health care provider may recommend:  Changes to your diet.  Medicine.  Surgery. Follow these instructions at home: Eating and drinking   Follow a diet as recommended by your health care provider. This may involve avoiding foods and drinks such as: ? Coffee and tea (with or without caffeine). ? Drinks that containalcohol. ? Energy drinks and sports drinks. ? Carbonated drinks or sodas. ? Chocolate and cocoa. ? Peppermint and mint flavorings. ? Garlic and onions. ? Horseradish. ? Spicy and acidic foods, including peppers, chili powder, curry powder, vinegar, hot sauces, and barbecue sauce. ? Citrus fruit juices and citrus   fruits, such as oranges, lemons, and limes. ? Tomato-based foods, such as red sauce, chili, salsa, and pizza with red sauce. ? Fried and fatty foods, such as donuts, french fries, potato chips, and high-fat dressings. ? High-fat  meats, such as hot dogs and fatty cuts of red and white meats, such as rib eye steak, sausage, ham, and bacon. ? High-fat dairy items, such as whole milk, butter, and cream cheese.  Eat small, frequent meals instead of large meals.  Avoid drinking large amounts of liquid with your meals.  Avoid eating meals during the 2-3 hours before bedtime.  Avoid lying down right after you eat.  Do not exercise right after you eat. Lifestyle   Do not use any products that contain nicotine or tobacco, such as cigarettes, e-cigarettes, and chewing tobacco. If you need help quitting, ask your health care provider.  Try to reduce your stress by using methods such as yoga or meditation. If you need help reducing stress, ask your health care provider.  If you are overweight, reduce your weight to an amount that is healthy for you. Ask your health care provider for guidance about a safe weight loss goal. General instructions  Pay attention to any changes in your symptoms.  Take over-the-counter and prescription medicines only as told by your health care provider. Do not take aspirin, ibuprofen, or other NSAIDs unless your health care provider told you to do so.  Wear loose-fitting clothing. Do not wear anything tight around your waist that causes pressure on your abdomen.  Raise (elevate) the head of your bed about 6 inches (15 cm).  Avoid bending over if this makes your symptoms worse.  Keep all follow-up visits as told by your health care provider. This is important. Contact a health care provider if:  You have: ? New symptoms. ? Unexplained weight loss. ? Difficulty swallowing or it hurts to swallow. ? Wheezing or a persistent cough. ? A hoarse voice.  Your symptoms do not improve with treatment. Get help right away if you:  Have pain in your arms, neck, jaw, teeth, or back.  Feel sweaty, dizzy, or light-headed.  Have chest pain or shortness of breath.  Vomit and your vomit looks  like blood or coffee grounds.  Faint.  Have stool that is bloody or black.  Cannot swallow, drink, or eat. Summary  Gastroesophageal reflux happens when acid from the stomach flows up into the esophagus. GERD is a disease in which the reflux happens often, causes frequent or severe symptoms, or causes problems such as damage to the esophagus.  Treatment for this condition may vary depending on how severe your symptoms are. Your health care provider may recommend diet and lifestyle changes, medicine, or surgery.  Contact a health care provider if you have new or worsening symptoms.  Take over-the-counter and prescription medicines only as told by your health care provider. Do not take aspirin, ibuprofen, or other NSAIDs unless your health care provider told you to do so.  Keep all follow-up visits as told by your health care provider. This is important. This information is not intended to replace advice given to you by your health care provider. Make sure you discuss any questions you have with your health care provider. Document Revised: 04/20/2018 Document Reviewed: 04/20/2018 Elsevier Patient Education  2020 [employer]  

## 2019-11-15 NOTE — Progress Notes (Signed)
Subjective:  Patient ID: Dominic Coleman, male    DOB: 22-Oct-1961  Age: 59 y.o. MRN: [id number]  CC: Gastroesophageal Reflux  This visit occurred during the SARS-CoV-2 public health emergency.  Safety protocols were in place, including screening questions prior to the visit, additional usage of staff PPE, and extensive cleaning of exam room while observing appropriate contact time as indicated for disinfecting solutions.   HPI Dominic Coleman presents for f/up - He recently underwent an upper endoscopy for symptoms related to GERD and was found to have Helicobacter pylori gastritis.  He has completed the antibiotic regimen for that.  He continues to take a PPI.  He tells me that all of his symptoms have resolved.  He denies heartburn, odynophagia, dysphagia, abdominal pain, and loss of appetite.  He comes in today as a follow-up on a history of elevated LFTs.  Outpatient Medications Prior to Visit  Medication Sig Dispense Refill  . dexlansoprazole (DEXILANT) 60 MG capsule Take 1 capsule (60 mg total) by mouth daily. 30 minutes before a meal 90 capsule 1  . Multiple Vitamin (MULTIVITAMIN) tablet Take 1 tablet by mouth daily.      Marland Kitchen thiamine (VITAMIN B-1) 100 MG tablet Take 1 tablet (100 mg total) by mouth daily. 90 tablet 1   No facility-administered medications prior to visit.    ROS Review of Systems  Constitutional: Negative for fatigue and unexpected weight change.  HENT: Negative.  Negative for sore throat, trouble swallowing and voice change.   Eyes: Negative.   Respiratory: Negative for cough, chest tightness, shortness of breath and wheezing.   Cardiovascular: Negative for chest pain, palpitations and leg swelling.  Gastrointestinal: Negative for abdominal pain, constipation, diarrhea, nausea and vomiting.  Endocrine: Negative.   Genitourinary: Negative.  Negative for difficulty urinating.  Musculoskeletal: Negative for arthralgias.  Skin: Negative.  Negative for color change and  pallor.  Neurological: Negative.  Negative for dizziness, weakness, light-headedness and headaches.  Hematological: Negative for adenopathy. Does not bruise/bleed easily.  Psychiatric/Behavioral: Negative.     Objective:  BP 130/78 (BP Location: Left Arm, Patient Position: Sitting, Cuff Size: Normal)   Pulse 64   Temp 98.2 F (36.8 C) (Oral)   Resp 16   Ht 5' 8"  (1.727 m)   Wt 155 lb (70.3 kg)   SpO2 98%   BMI 23.57 kg/m   BP Readings from Last 3 Encounters:  11/15/19 130/78  11/09/19 112/70  09/28/19 113/67    Wt Readings from Last 3 Encounters:  11/15/19 155 lb (70.3 kg)  11/09/19 155 lb 8 oz (70.5 kg)  09/28/19 156 lb (70.8 kg)    Physical Exam Vitals reviewed.  Constitutional:      Appearance: Normal appearance.  HENT:     Nose: Nose normal.     Mouth/Throat:     Mouth: Mucous membranes are moist.  Eyes:     General: No scleral icterus.    Conjunctiva/sclera: Conjunctivae normal.  Cardiovascular:     Rate and Rhythm: Normal rate and regular rhythm.     Heart sounds: No murmur.  Pulmonary:     Effort: Pulmonary effort is normal.     Breath sounds: No stridor. No wheezing, rhonchi or rales.  Abdominal:     General: Abdomen is flat. Bowel sounds are normal. There is no distension.     Palpations: Abdomen is soft. There is no hepatomegaly, splenomegaly or mass.     Tenderness: There is no abdominal tenderness. There is no guarding.  Musculoskeletal:  General: Normal range of motion.     Cervical back: Neck supple.     Right lower leg: No edema.     Left lower leg: No edema.  Lymphadenopathy:     Cervical: No cervical adenopathy.  Skin:    General: Skin is warm and dry.  Neurological:     General: No focal deficit present.     Mental Status: He is alert.  Psychiatric:        Mood and Affect: Mood normal.        Behavior: Behavior normal.     Lab Results  Component Value Date   WBC 2.6 (L) 09/19/2019   HGB 13.2 09/19/2019   HCT 39.2  09/19/2019   PLT 276.0 09/19/2019   GLUCOSE 85 04/11/2018   CHOL 175 02/13/2019   TRIG 49 02/13/2019   HDL 69 02/13/2019   LDLCALC 96 02/13/2019   ALT 32 11/15/2019   AST 32 11/15/2019   NA 142 02/13/2019   K 4.5 02/13/2019   CL 102 02/13/2019   CREATININE 1.0 02/13/2019   BUN 17 02/13/2019   CO2 24 (A) 02/13/2019   TSH 1.36 04/11/2018   PSA 1.20 09/19/2019   INR 1.2 (H) 11/15/2019   HGBA1C 5.7 02/13/2019    DG SWALLOWING FUNC-SPEECH PATHOLOGY  Result Date: 12/01/2016 Objective Swallowing Evaluation: Type of Study: MBS-Modified Barium Swallow Study Patient Details Name: Dominic Coleman MRN: [id number] Date of Birth: 14-Jun-1961 Today's Date: 12/01/2016 Time: SLP Start Time (ACUTE ONLY): 1305-SLP Stop Time (ACUTE ONLY): 1330 SLP Time Calculation (min) (ACUTE ONLY): 25 min Past Medical History: Past Medical History: Diagnosis Date . Abnormal EKG   work up at [employer] in 2008 (+conc. LVH) . Allergy  . Anemia  . History of pneumonia  . Positive PPD  . PPD positive 2001 Past Surgical History: No past surgical history on file. HPI: 59 y.o. male referred by Dr. Ernesto Rutherford, ENT, for [facility].  Pt reports hx of voice changes ("raspy" quality) since November 2017, as well as mild intermittent pressure/pain above sternal notch that he notices when he is straining, lifting weights, or yelling.  He denies difficulty swallowing, heartburn, globus, coughing.   Subjective: pleasant, good historian Assessment / Plan / Recommendation CHL IP CLINICAL IMPRESSIONS 12/01/2016 Therapy Diagnosis WFL Clinical Impression Pt presents with normal oropharyngeal swallow function marked by adequate mastication, timely swallow response, good hyolaryngeal mobility with adequate bolus clearance through pharynx.  No penetration/aspiration.  Noted mild hypertrophy of cricopharyngeus.  Brief esophageal sweep revealed no barium residue; no difficulty transferring 13 mm barium pill through pharynx and esophagus.   Impact on safety and function No  limitations   CHL IP TREATMENT RECOMMENDATION 12/01/2016 Treatment Recommendations No treatment recommended at this time   No flowsheet data found. CHL IP DIET RECOMMENDATION 12/01/2016 SLP Diet Recommendations Regular solids;Thin liquid Liquid Administration via Cup;Straw Medication Administration Whole meds with liquid Compensations -- Postural Changes --   CHL IP OTHER RECOMMENDATIONS 12/01/2016 Recommended Consults -- Oral Care Recommendations Oral care BID Other Recommendations --   CHL IP FOLLOW UP RECOMMENDATIONS 12/01/2016 Follow up Recommendations None   No flowsheet data found.     CHL IP ORAL PHASE 12/01/2016 Oral Phase WFL Oral - Pudding Teaspoon -- Oral - Pudding Cup -- Oral - Honey Teaspoon -- Oral - Honey Cup -- Oral - Nectar Teaspoon -- Oral - Nectar Cup -- Oral - Nectar Straw -- Oral - Thin Teaspoon -- Oral - Thin Cup -- Oral - Thin Straw -- Oral -  Puree -- Oral - Mech Soft -- Oral - Regular -- Oral - Multi-Consistency -- Oral - Pill -- Oral Phase - Comment --  CHL IP PHARYNGEAL PHASE 12/01/2016 Pharyngeal Phase WFL Pharyngeal- Pudding Teaspoon -- Pharyngeal -- Pharyngeal- Pudding Cup -- Pharyngeal -- Pharyngeal- Honey Teaspoon -- Pharyngeal -- Pharyngeal- Honey Cup -- Pharyngeal -- Pharyngeal- Nectar Teaspoon -- Pharyngeal -- Pharyngeal- Nectar Cup -- Pharyngeal -- Pharyngeal- Nectar Straw -- Pharyngeal -- Pharyngeal- Thin Teaspoon -- Pharyngeal -- Pharyngeal- Thin Cup -- Pharyngeal -- Pharyngeal- Thin Straw -- Pharyngeal -- Pharyngeal- Puree -- Pharyngeal -- Pharyngeal- Mechanical Soft -- Pharyngeal -- Pharyngeal- Regular -- Pharyngeal -- Pharyngeal- Multi-consistency -- Pharyngeal -- Pharyngeal- Pill -- Pharyngeal -- Pharyngeal Comment --  CHL IP CERVICAL ESOPHAGEAL PHASE 12/01/2016 Cervical Esophageal Phase WFL Pudding Teaspoon -- Pudding Cup -- Honey Teaspoon -- Honey Cup -- Nectar Teaspoon -- Nectar Cup -- Nectar Straw -- Thin Teaspoon -- Thin Cup -- Thin Straw -- Puree -- Mechanical Soft -- Regular --  Multi-consistency -- Pill -- Cervical Esophageal Comment -- CHL IP GO 12/01/2016 Functional Assessment Tool Used clinical judgment Functional Limitations Swallowing Swallow Current Status (X2811) CH Swallow Goal Status (W8677) [place] Swallow Discharge Status (J7366) [place] Motor Speech Current Status (K1594) (None) Motor Speech Goal Status (L0761) (None) Motor Speech Goal Status (H1834) (None) Spoken Language Comprehension Current Status (P7357) (None) Spoken Language Comprehension Goal Status (I9784) (None) Spoken Language Comprehension Discharge Status (R8412) (None) Spoken Language Expression Current Status (K2081) (None) Spoken Language Expression Goal Status (N8871) (None) Spoken Language Expression Discharge Status [phone]) (None) Attention Current Status (X1855) (None) Attention Goal Status (M1586) (None) Attention Discharge Status (W2574) (None) Memory Current Status (V3552) (None) Memory Goal Status (Z7471) (None) Memory Discharge Status (T9539) (None) Voice Current Status (Y7289) (None) Voice Goal Status (T9150) (None) Voice Discharge Status (C1364) (None) Other Speech-Language Pathology Functional Limitation (B8377) (None) Other Speech-Language Pathology Functional Limitation Goal Status (P3968) (None) Other Speech-Language Pathology Functional Limitation Discharge Status [phone]) (None) Juan Quam Laurice 12/01/2016, 1:48 PM               Assessment & Plan:   Vollie was seen today for gastroesophageal reflux.  Diagnoses and all orders for this visit:  Hyperlipidemia, unspecified hyperlipidemia type  Need for shingles vaccine -     Varicella-zoster vaccine IM (Shingrix)  Elevated LFTs- His liver enzymes are better but still mildly elevated.  His MELD-Na+ score is 8.  I have asked him to undergo a liver ultrasound to see if there is evidence of NASH. -     Hepatic function panel -     Protime-INR -     Cancel: US Abdomen Limited RUQ; Future -     US Abdomen Limited RUQ; Future  Thiamine  deficiency  Helicobacter positive gastritis   I am having Dominic Coleman maintain his multivitamin, Dexilant, and thiamine.  No orders of the defined types were placed in this encounter.    Follow-up: Return in about 3 months (around 02/13/2020).  Scarlette Calico, MD

## 2019-11-16 ENCOUNTER — Encounter: Payer: Self-pay | Admitting: Internal Medicine

## 2019-12-11 ENCOUNTER — Telehealth: Payer: Self-pay | Admitting: *Deleted

## 2019-12-11 ENCOUNTER — Encounter: Payer: Self-pay | Admitting: *Deleted

## 2019-12-11 NOTE — Telephone Encounter (Signed)
-----   Message from Dalene Seltzer, RN sent at 10/09/2019 10:38 AM EST ----- Regarding: H Pylori Stool Test The patient needs to come in this week for his h pylori special antigen stool test. Order is already in [place].

## 2019-12-11 NOTE — Telephone Encounter (Signed)
Called patient and left message requesting he come in for h. Pylori stool kit.    Correction: called patient back after looking at a change in plan of care by Dr. Beavers. Patient is not due to come in until March for retesting.    

## 2019-12-14 ENCOUNTER — Other Ambulatory Visit: Payer: Managed Care, Other (non HMO)

## 2019-12-18 ENCOUNTER — Ambulatory Visit
Admission: RE | Admit: 2019-12-18 | Discharge: 2019-12-18 | Disposition: A | Discharge status: Home / Self Care | Payer: Managed Care, Other (non HMO) | Source: Ambulatory Visit | Attending: Internal Medicine | Admitting: Internal Medicine

## 2019-12-18 ENCOUNTER — Other Ambulatory Visit: Payer: Self-pay | Admitting: Internal Medicine

## 2019-12-18 ENCOUNTER — Encounter: Payer: Self-pay | Admitting: Internal Medicine

## 2019-12-18 DIAGNOSIS — R7989 Other specified abnormal findings of blood chemistry: Secondary | ICD-10-CM

## 2020-01-29 ENCOUNTER — Telehealth: Payer: Self-pay | Admitting: Gastroenterology

## 2020-01-29 NOTE — Telephone Encounter (Signed)
Discussed with pt that he needed to come for H pylori stool antigen test, he is aware.

## 2020-01-29 NOTE — Telephone Encounter (Signed)
Patient is calling- he states that Dr. Tarri Glenn wanted him to come back after taking the dexilant to follow up about the H. Pylori. He is asking if he needs to make an appointment to see Dr. Tarri Glenn or do another stool sample.

## 2020-01-30 ENCOUNTER — Other Ambulatory Visit: Payer: Managed Care, Other (non HMO)

## 2020-01-30 DIAGNOSIS — A048 Other specified bacterial intestinal infections: Secondary | ICD-10-CM

## 2020-01-31 LAB — HELICOBACTER PYLORI  SPECIAL ANTIGEN
MICRO NUMBER:: 10332322
SPECIMEN QUALITY: ADEQUATE

## 2020-02-14 ENCOUNTER — Other Ambulatory Visit: Payer: Managed Care, Other (non HMO)

## 2020-02-15 ENCOUNTER — Other Ambulatory Visit: Payer: Managed Care, Other (non HMO)

## 2020-03-08 IMAGING — US US ABDOMEN LIMITED
1 series · 14 of 25 positions shown · non-contrast
Comparison: No priors.

CLINICAL DATA: 58-year-old male with history of elevated liver
function tests.

EXAM:
ULTRASOUND ABDOMEN LIMITED RIGHT UPPER QUADRANT

[Series 1: us abdomen limited · 0.23mm/px · 14 of 37 slices shown]
[im 1/37]
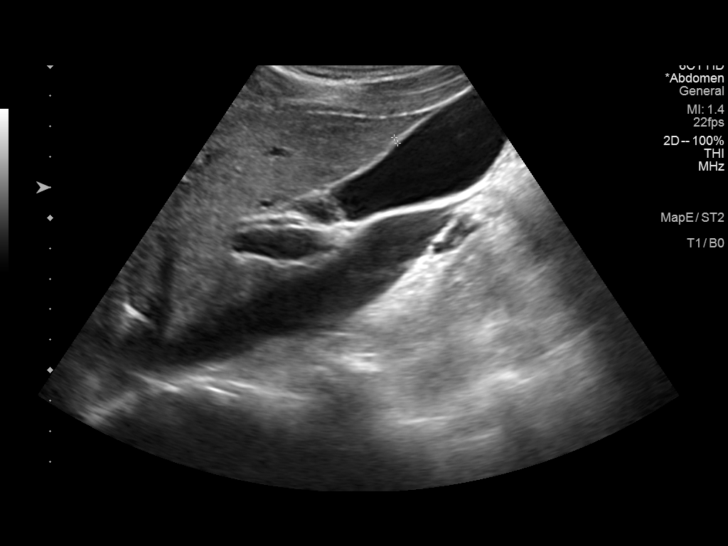
[im 4/37]
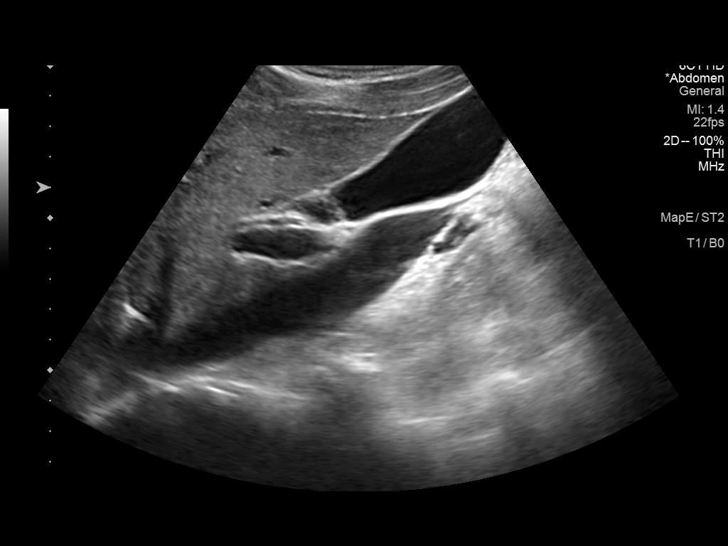
[im 7/37]
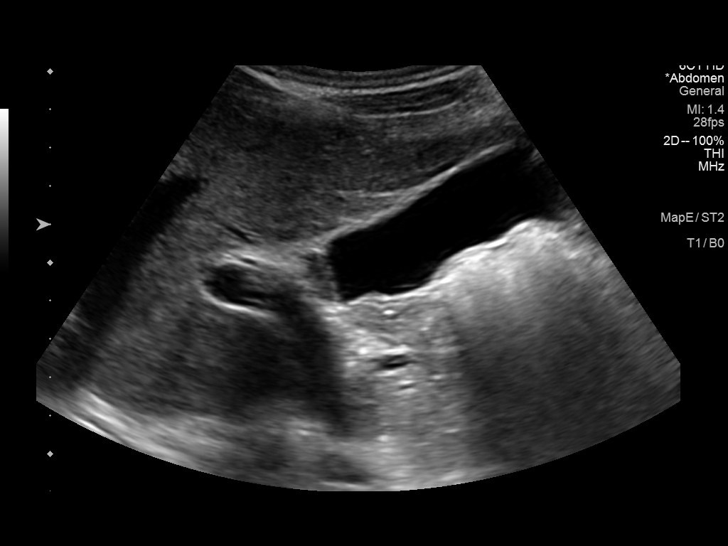
[im 10/37]
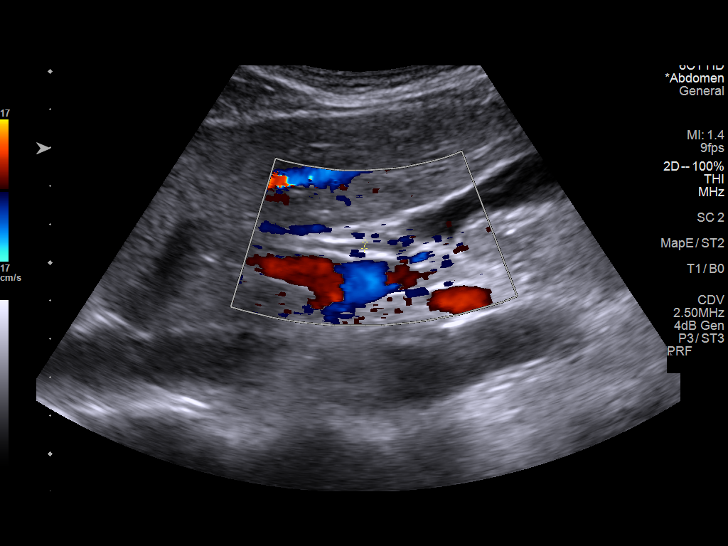
[im 13/37]
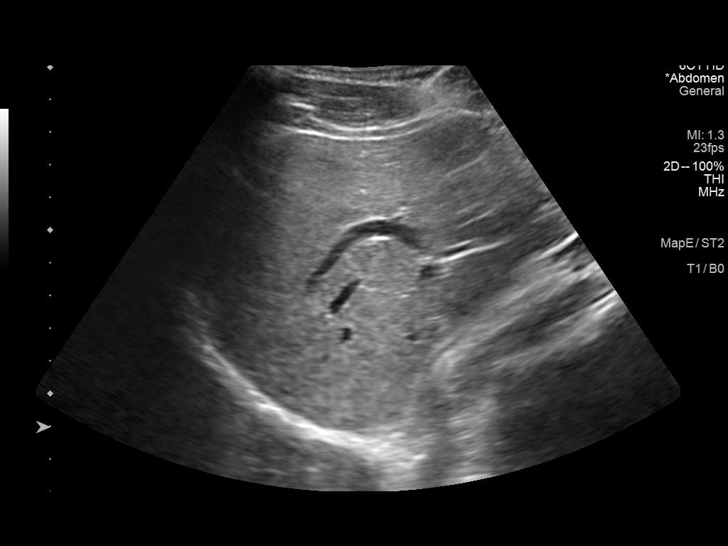
[im 14/37]
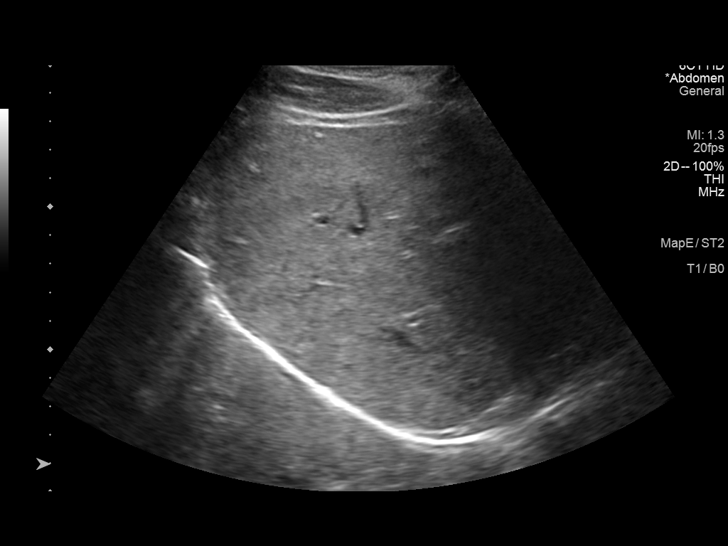
[im 17/37]
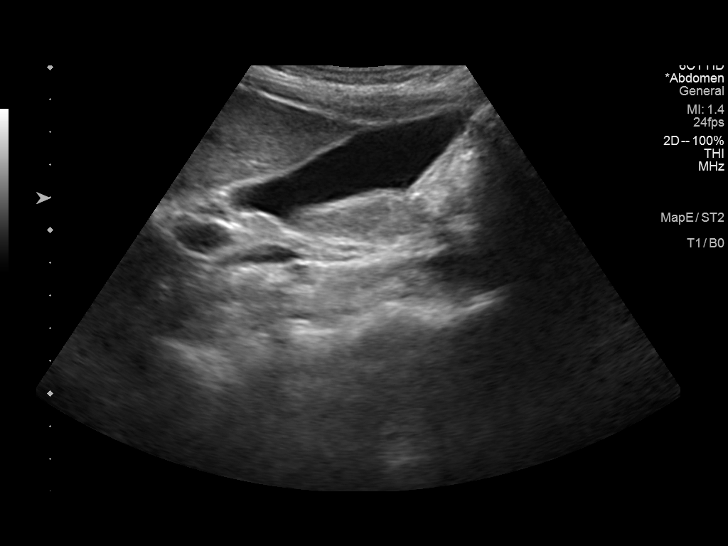
[im 20/37]
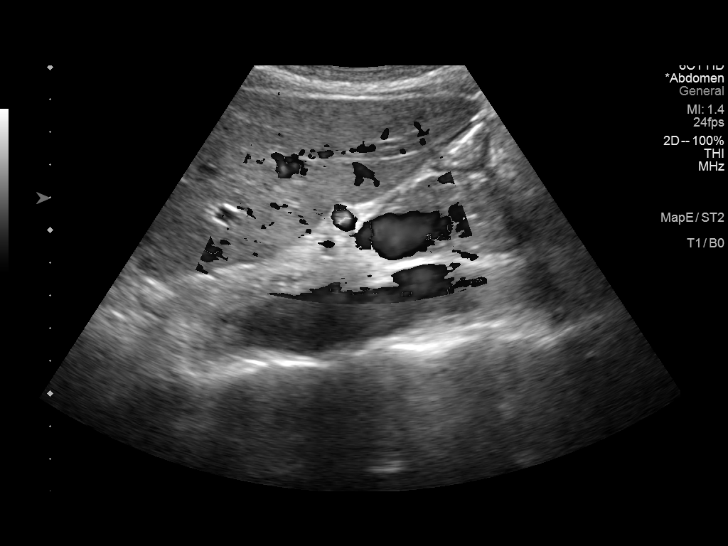
[im 23/37]
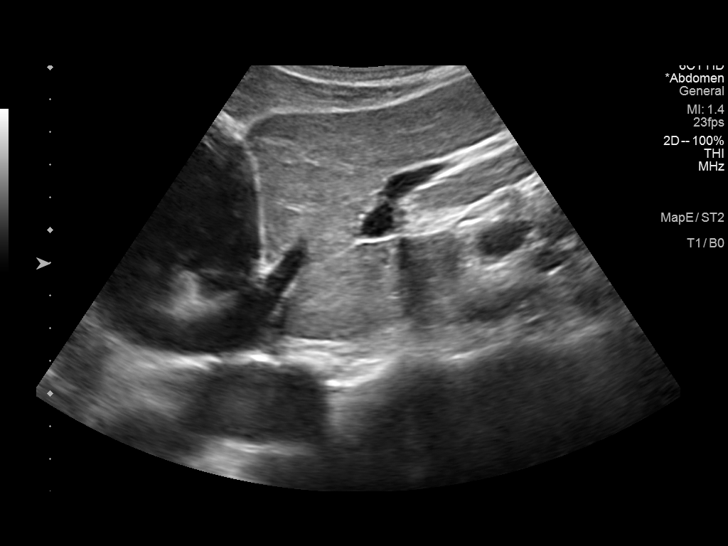
[im 25/37]
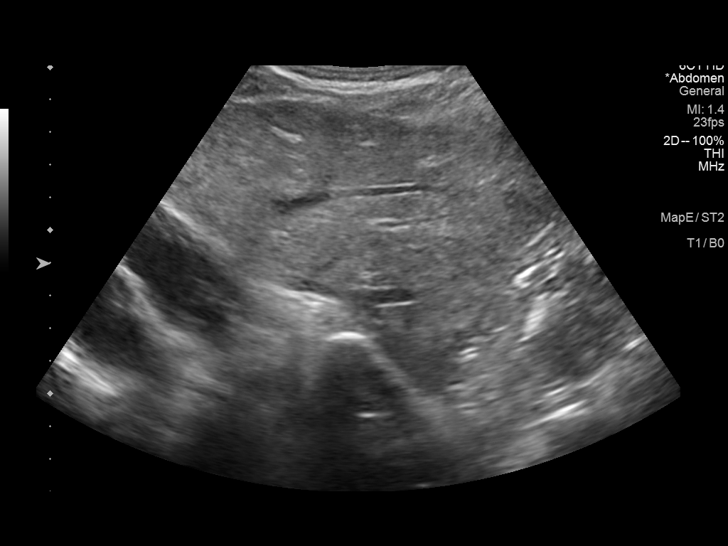
[im 28/37]
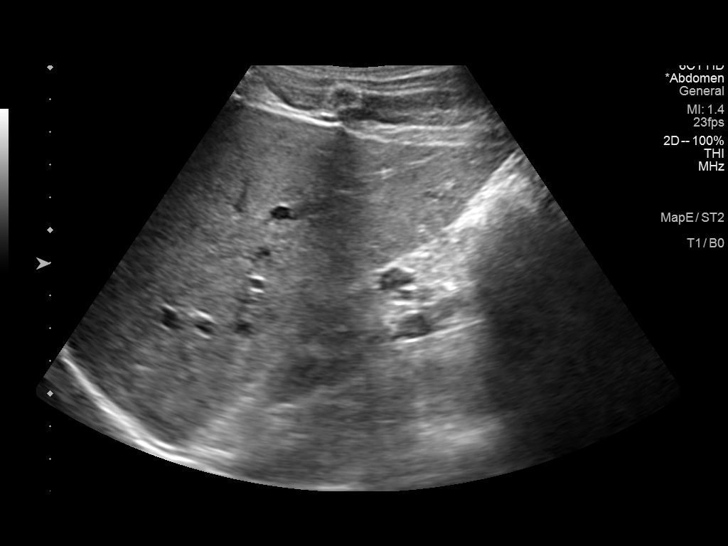
[im 31/37]
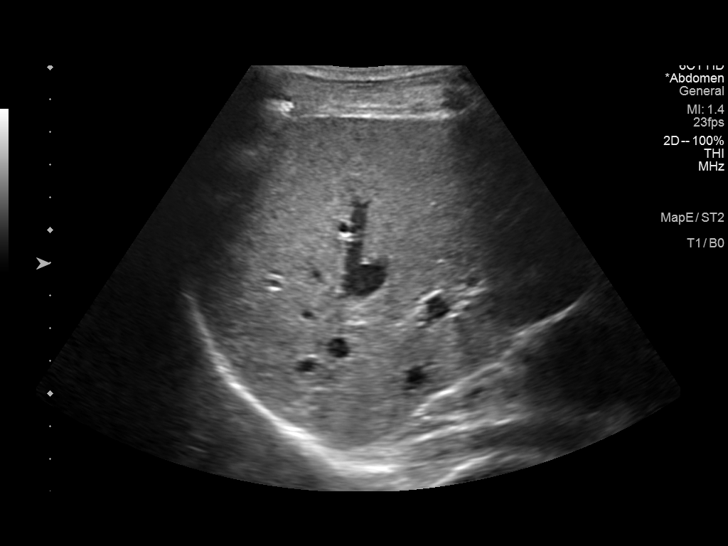
[im 34/37]
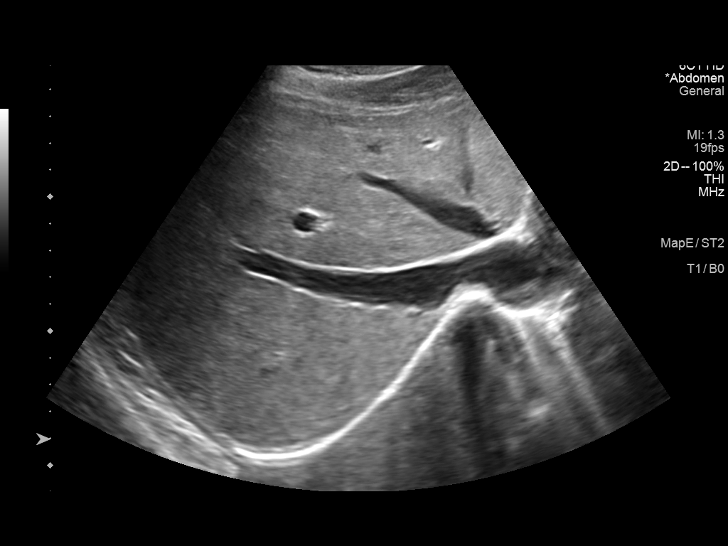
[im 37/37]
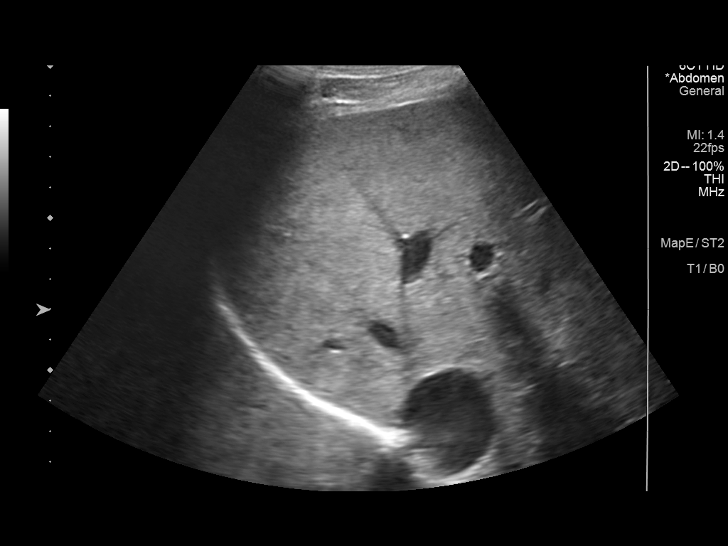

[14 of 25 positions shown; findings below may reference images not displayed]

FINDINGS: Gallbladder:

No gallstones or wall thickening visualized. No sonographic Murphy
sign noted by sonographer.

Common bile duct:

Diameter: 1.7 mm

Liver:

No focal lesion identified. Within normal limits in parenchymal
echogenicity. Portal vein is patent on color Doppler imaging with
normal direction of blood flow towards the liver.

Other: None.
IMPRESSION: 1. No acute findings. Specifically, no evidence of cholelithiasis,
choledocholithiasis or acute cholecystitis.

## 2020-07-29 ENCOUNTER — Other Ambulatory Visit: Payer: Self-pay

## 2020-07-29 ENCOUNTER — Ambulatory Visit (INDEPENDENT_AMBULATORY_CARE_PROVIDER_SITE_OTHER): Payer: Managed Care, Other (non HMO)

## 2020-07-29 ENCOUNTER — Ambulatory Visit (INDEPENDENT_AMBULATORY_CARE_PROVIDER_SITE_OTHER): Payer: Managed Care, Other (non HMO) | Admitting: Family

## 2020-07-29 VITALS — BP 126/80 | HR 73 | Temp 98.4°F | Ht 68.0 in | Wt 156.0 lb

## 2020-07-29 DIAGNOSIS — M25561 Pain in right knee: Secondary | ICD-10-CM

## 2020-07-29 DIAGNOSIS — M79672 Pain in left foot: Secondary | ICD-10-CM

## 2020-07-29 DIAGNOSIS — M722 Plantar fascial fibromatosis: Secondary | ICD-10-CM | POA: Diagnosis not present

## 2020-07-29 NOTE — Progress Notes (Signed)
Dominic Coleman. is a 59 y.o. male with the following history as recorded in [employer]:  Patient Active Problem List   Diagnosis Date Noted  . Elevated LFTs 11/15/2019  . Thiamine deficiency 09/26/2019  . Helicobacter positive gastritis 09/19/2019  . Gastroesophageal reflux disease with esophagitis and hemorrhage 09/19/2019  . Family history of colon cancer 09/19/2019  . HLD (hyperlipidemia) 04/11/2018  . Abnormal EKG 03/22/2014  . Routine general medical examination at a health care facility 04/26/2013  . Incidental lung nodule, greater than or equal to 36m 04/26/2013  . 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 ALLERGIC RHINITIS 12/23/2009    Current Outpatient Medications  Medication Sig Dispense Refill  . dexlansoprazole (DEXILANT) 60 MG capsule Take 1 capsule (60 mg total) by mouth daily. 30 minutes before a meal 90 capsule 1  . Multiple Vitamin (MULTIVITAMIN) tablet Take 1 tablet by mouth daily.      .Marland Kitchenthiamine (VITAMIN B-1) 100 MG tablet Take 1 tablet (100 mg total) by mouth daily. 90 tablet 1   No current facility-administered medications for this visit.    Allergies: Patient has no known allergies.  Past Medical History:  Diagnosis Date  . [facility]normal [facility]    [facility]  . Allergy   . Anemia   . History of pneumonia   . Positive PPD   . PPD positive 2001    Past Surgical History:  Procedure Laterality Date  . COLONOSCOPY    . DENTAL SURGERY      Family History  Problem Relation Age of Onset  . Hypertension Other   . Hyperlipidemia Other   . Colon polyps Sister   . Colon cancer Brother   . Colon polyps Brother   . Cancer Neg Hx   . Diabetes Neg Hx   . Early death Neg Hx   . Hearing loss Neg Hx   . Kidney disease Neg Hx   . Stroke Neg Hx   . Alcohol abuse Neg Hx     Social History   Tobacco Use  . Smoking status: Never Smoker  . Smokeless tobacco: Never Used  Substance Use Topics  .  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 with limited benefit;      Objective:  Vitals:   07/29/20 1055  BP: 126/80  Pulse: 73  Temp: 98.4 F (36.9 C)  TempSrc: Oral  SpO2: 99%  Weight: 156 lb (70.8 kg)  Height: 5' 8"  (1.727 m)    General: Well developed, well nourished, in no acute distress  Head: Normocephalic and atraumatic  Lungs: Respirations unlabored;  Musculoskeletal: No deformities; no active joint inflammation  Extremities: No edema, cyanosis, clubbing  Vessels: Symmetric bilaterally  Neurologic: Alert and oriented; speech intact; face symmetrical; moves all extremities well; CNII-XII intact without focal deficit   Assessment:  1. Right knee pain, unspecified chronicity   2. Left foot pain     Plan:  Will update X-rays today; patient is not a candidate for NSAIDs at this time; refer to podiatry and sports medicine;  Keep planned follow-up with his PCP for CPE later this year;  This visit occurred during the SARS-CoV-2 public health emergency.  Safety protocols were in place, including screening questions prior to the visit, additional usage of staff PPE, and extensive cleaning of exam room while observing appropriate contact time as indicated for disinfecting solutions.     No follow-ups on file.  Orders Placed This Encounter  Procedures  . DG Knee Complete 4 Views Right    Standing Status:   Future    Standing Expiration Date:   07/29/2021    Order Specific Question:   Reason for Exam (SYMPTOM  OR DIAGNOSIS REQUIRED)    Answer:   right knee pain    Order Specific Question:   Preferred imaging location?     Answer:   [facility]  . DG Foot Complete Left    Standing Status:   Future    Standing Expiration Date:   07/29/2021    Order Specific Question:   Reason for Exam (SYMPTOM  OR DIAGNOSIS REQUIRED)    Answer:   heel pain    Order Specific Question:   Preferred imaging location?    Answer:   [facility]    Requested Prescriptions    No prescriptions requested or ordered in this encounter

## 2020-07-31 ENCOUNTER — Encounter: Payer: Self-pay | Admitting: Family

## 2020-08-02 LAB — COMPREHENSIVE METABOLIC PANEL
Albumin: 4.2 (ref 3.5–5.0)
Calcium: 9.3 (ref 8.7–10.7)
GFR calc Af Amer: 108
Globulin: 3

## 2020-08-02 LAB — LIPID PANEL
Cholesterol: 193 (ref 0–200)
HDL: 61 (ref 35–70)
LDL Cholesterol: 119
Triglycerides: 72 (ref 40–160)

## 2020-08-02 LAB — BASIC METABOLIC PANEL
BUN: 17 (ref 4–21)
CO2: 25 — AB (ref 13–22)
Chloride: 104 (ref 99–108)
Creatinine: 0.9 (ref 0.6–1.3)
Glucose: 93
Potassium: 4.4 (ref 3.4–5.3)
Sodium: 142 (ref 137–147)

## 2020-08-02 LAB — CBC AND DIFFERENTIAL
HCT: 42 (ref 41–53)
Hemoglobin: 13.5 (ref 13.5–17.5)
Platelets: 287 (ref 150–399)
WBC: 3

## 2020-08-02 LAB — HEMOGLOBIN A1C: Hemoglobin A1C: 5.9

## 2020-08-02 LAB — HEPATIC FUNCTION PANEL
ALT: 25 (ref 10–40)
AST: 29 (ref 14–40)
Alkaline Phosphatase: 88 (ref 25–125)
Bilirubin, Total: 0.2

## 2020-08-15 ENCOUNTER — Encounter: Payer: Self-pay | Admitting: Podiatry

## 2020-08-15 ENCOUNTER — Ambulatory Visit (INDEPENDENT_AMBULATORY_CARE_PROVIDER_SITE_OTHER): Payer: Managed Care, Other (non HMO) | Admitting: Podiatry

## 2020-08-15 ENCOUNTER — Ambulatory Visit (INDEPENDENT_AMBULATORY_CARE_PROVIDER_SITE_OTHER): Payer: Managed Care, Other (non HMO)

## 2020-08-15 ENCOUNTER — Other Ambulatory Visit: Payer: Self-pay

## 2020-08-15 DIAGNOSIS — M7732 Calcaneal spur, left foot: Secondary | ICD-10-CM | POA: Diagnosis not present

## 2020-08-15 DIAGNOSIS — M79672 Pain in left foot: Secondary | ICD-10-CM

## 2020-08-15 DIAGNOSIS — M722 Plantar fascial fibromatosis: Secondary | ICD-10-CM | POA: Diagnosis not present

## 2020-08-15 MED ORDER — MELOXICAM 15 MG PO TABS: 15.0000 mg | ORAL_TABLET | Freq: Every day | ORAL | 0 refills | Status: DC

## 2020-08-15 NOTE — Patient Instructions (Signed)

## 2020-08-20 NOTE — Progress Notes (Signed)
Subjective:   Patient ID: Dominic Coleman., male   DOB: 59 y.o.   MRN: [id number]   HPI 59 year old male presents the office with concerns of pain to the bottom of his left heel which started in March.  This started after he was running on a trail and since that has had intermittent discomfort describes sharp pain to the bottom of his heel.  He is purchased over-the-counter arch supports which helps with discomfort at times as well.  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  He knows exercises to help.   Review of Systems  All other systems reviewed and are negative.  Past Medical History:  Diagnosis Date   Abnormal EKG    work up at [employer] in 2008 (+conc. LVH)   Allergy    Anemia    History of pneumonia    Positive PPD    PPD positive 2001    Past Surgical History:  Procedure Laterality Date   COLONOSCOPY     DENTAL SURGERY       Current Outpatient Medications:    dexlansoprazole (DEXILANT) 60 MG capsule, Take 1 capsule (60 mg total) by mouth daily. 30 minutes before a meal, Disp: 90 capsule, Rfl: 1   meloxicam (MOBIC) 15 MG tablet, Take 1 tablet (15 mg total) by mouth daily., Disp: 30 tablet, Rfl: 0   Multiple Vitamin (MULTIVITAMIN) tablet, Take 1 tablet by mouth daily.  , Disp: , Rfl:    thiamine (VITAMIN B-1) 100 MG tablet, Take 1 tablet (100 mg total) by mouth daily., Disp: 90 tablet, Rfl: 1  No Known Allergies      Objective:  Physical Exam  General: AAO x3, NAD  Dermatological: Skin is warm, dry and supple bilateral.  There are no open sores, no preulcerative lesions, no rash or signs of infection present.  Vascular: Dorsalis Pedis artery and Posterior Tibial artery pedal pulses are 2/4 bilateral with immedate capillary fill time.There is no pain with calf compression, swelling, warmth, erythema.   Neruologic: Grossly intact via light touch bilateral.  Negative Tinel sign  Musculoskeletal: There is tenderness palpation on  plantar medial tubercle of the calcaneus with insertion of plantar fascial the left side.  Minimal discomfort the arch of the foot on the plantar fracture.  There is no pain with lateral compression of calcaneus no pain in the Achilles tendon.  Flexor, extensor tendons appear to be intact.  Muscular strength 5/5 in all groups tested bilateral.  Gait: Unassisted, Nonantalgic.       Assessment:   Left heel pain, plantar fasciitis/heel spur     Plan:  -Treatment options discussed including all alternatives, risks, and complications -Etiology of symptoms were discussed -X-rays obtained reviewed shows no evidence of acute fracture.  Inferior calcaneal spurring is evident. -Steroid injection performed.  See procedure note below -Prescribed mobic. Discussed side effects of the medication and directed to stop if any are to occur and call the office.  -Stretching/icing daily -Discussed shoe modifications to continue orthotics  Procedure: Injection Tendon/Ligament Discussed alternatives, risks, complications and verbal consent was obtained.  Location: Left plantar fascia at the glabrous junction; medial approach. Skin Prep: Alcohol. Injectate: 0.5cc 0.5% marcaine plain, 0.5 cc 2% lidocaine plain and, 1 cc kenalog 10. Disposition: Patient tolerated procedure well. Injection site dressed with a band-aid.  Post-injection care was discussed and return precautions discussed.   Return in about 6 weeks (around 09/26/2020), or if symptoms worsen or fail  to improve.  Trula Slade DPM

## 2020-09-23 ENCOUNTER — Encounter: Payer: Self-pay | Admitting: Internal Medicine

## 2020-09-23 ENCOUNTER — Ambulatory Visit (INDEPENDENT_AMBULATORY_CARE_PROVIDER_SITE_OTHER): Payer: Managed Care, Other (non HMO) | Admitting: Internal Medicine

## 2020-09-23 ENCOUNTER — Other Ambulatory Visit: Payer: Self-pay

## 2020-09-23 ENCOUNTER — Ambulatory Visit (INDEPENDENT_AMBULATORY_CARE_PROVIDER_SITE_OTHER): Payer: Managed Care, Other (non HMO)

## 2020-09-23 VITALS — BP 136/82 | HR 68 | Temp 98.1°F | Ht 68.0 in | Wt 157.0 lb

## 2020-09-23 DIAGNOSIS — G8929 Other chronic pain: Secondary | ICD-10-CM | POA: Insufficient documentation

## 2020-09-23 DIAGNOSIS — M25561 Pain in right knee: Secondary | ICD-10-CM | POA: Diagnosis not present

## 2020-09-23 DIAGNOSIS — R7989 Other specified abnormal findings of blood chemistry: Secondary | ICD-10-CM | POA: Diagnosis not present

## 2020-09-23 DIAGNOSIS — E519 Thiamine deficiency, unspecified: Secondary | ICD-10-CM | POA: Diagnosis not present

## 2020-09-23 DIAGNOSIS — Z125 Encounter for screening for malignant neoplasm of prostate: Secondary | ICD-10-CM | POA: Diagnosis not present

## 2020-09-23 DIAGNOSIS — Z0000 Encounter for general adult medical examination without abnormal findings: Secondary | ICD-10-CM | POA: Diagnosis not present

## 2020-09-23 LAB — PSA: PSA: 0.97 ng/mL (ref 0.10–4.00)

## 2020-09-23 NOTE — Progress Notes (Signed)
Subjective:  Patient ID: Dominic Coleman, male    DOB: Jan 12, 1961  Age: 59 y.o. MRN: [id number]  CC: Annual Exam  This visit occurred during the SARS-CoV-2 public health emergency.  Safety protocols were in place, including screening questions prior to the visit, additional usage of staff PPE, and extensive cleaning of exam room while observing appropriate contact time as indicated for disinfecting solutions.    HPI Dominic Coleman presents for a CPX.  He tells me that about 8 months ago he developed nontraumatic pain and swelling in his right knee.  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  The knee no longer feels swollen but he has mild, intermittent stiffness in the knee.  The range of motion is good.  The knee does not buckle or feel unstable.  He has pain in his left foot that is being treated by podiatry but none of his other joints bother him.  He had labs done elsewhere recently that were all within normal limits.  His LFTs are normal now.  He does not experience abdominal pain and does not drink alcohol.  He has a history of thiamine deficiency and is not currently taking a thiamine supplement.  He walks about 2 miles a day and does not experience chest pain, shortness of breath, palpitations, edema, or fatigue.  Outpatient Medications Prior to Visit  Medication Sig Dispense Refill  . dexlansoprazole (DEXILANT) 60 MG capsule Take 1 capsule (60 mg total) by mouth daily. 30 minutes before a meal 90 capsule 1  . meloxicam (MOBIC) 15 MG tablet Take 1 tablet (15 mg total) by mouth daily. 30 tablet 0  . Multiple Vitamin (MULTIVITAMIN) tablet Take 1 tablet by mouth daily.      Marland Kitchen thiamine (VITAMIN B-1) 100 MG tablet Take 1 tablet (100 mg total) by mouth daily. 90 tablet 1   No facility-administered medications prior to visit.    ROS Review of Systems  Constitutional: Negative.  Negative for chills, diaphoresis, fatigue and  fever.  HENT: Negative.   Eyes: Negative.   Respiratory: Negative for cough, chest tightness, shortness of breath and wheezing.   Cardiovascular: Negative for chest pain, palpitations and leg swelling.  Gastrointestinal: Negative for abdominal pain, constipation, diarrhea, nausea and vomiting.  Endocrine: Negative.   Genitourinary: Negative.  Negative for difficulty urinating, dysuria, penile swelling, scrotal swelling and testicular pain.  Musculoskeletal: Positive for arthralgias. Negative for myalgias and neck pain.  Skin: Negative.   Neurological: Negative.  Negative for dizziness and weakness.  Hematological: Negative for adenopathy. Does not bruise/bleed easily.  Psychiatric/Behavioral: Negative.     Objective:  BP 136/82   Pulse 68   Temp 98.1 F (36.7 C) (Oral)   Ht 5' 8"  (1.727 m)   Wt 157 lb (71.2 kg)   SpO2 98%   BMI 23.87 kg/m   BP Readings from Last 3 Encounters:  09/23/20 136/82  07/29/20 126/80  11/15/19 130/78    Wt Readings from Last 3 Encounters:  09/23/20 157 lb (71.2 kg)  07/29/20 156 lb (70.8 kg)  11/15/19 155 lb (70.3 kg)    Physical Exam Vitals reviewed.  Constitutional:      Appearance: Normal appearance.  HENT:     Nose: Nose normal.  Eyes:     General: No scleral icterus.    Conjunctiva/sclera: Conjunctivae normal.  Cardiovascular:     Rate and Rhythm: Normal rate and regular rhythm.  Heart sounds: No murmur heard.   Pulmonary:     Effort: Pulmonary effort is normal.     Breath sounds: No stridor. No wheezing, rhonchi or rales.  Abdominal:     General: Abdomen is flat. Bowel sounds are normal. There is no distension.     Palpations: Abdomen is soft. There is no hepatomegaly, splenomegaly or mass.     Tenderness: There is no abdominal tenderness.     Hernia: There is no hernia in the left inguinal area or right inguinal area.  Genitourinary:    Pubic Area: No rash.      Penis: Normal and uncircumcised. No phimosis, paraphimosis,  hypospadias, erythema, tenderness, discharge, swelling or lesions.      Testes: Normal.        Right: Mass or tenderness not present.        Left: Mass or tenderness not present.     Epididymis:     Right: Normal. Not inflamed or enlarged.     Left: Normal. Not inflamed or enlarged.     Prostate: Normal. Not enlarged, not tender and no nodules present.     Rectum: Normal. Guaiac result negative. No mass, tenderness, anal fissure, external hemorrhoid or internal hemorrhoid. Normal anal tone.  Musculoskeletal:        General: Normal range of motion.     Cervical back: Neck supple.     Right knee: Deformity (DJD) present. No swelling, effusion, erythema or bony tenderness. Normal range of motion. No tenderness. Normal alignment.     Left knee: Normal.     Right lower leg: No edema.     Left lower leg: No edema.  Lymphadenopathy:     Cervical: No cervical adenopathy.     Lower Body: No right inguinal adenopathy. No left inguinal adenopathy.  Skin:    General: Skin is warm and dry.     Coloration: Skin is not jaundiced or pale.  Neurological:     General: No focal deficit present.     Mental Status: He is alert and oriented to person, place, and time. Mental status is at baseline.  Psychiatric:        Mood and Affect: Mood normal.        Behavior: Behavior normal.     Lab Results  Component Value Date   WBC 3.0 08/02/2020   HGB 13.5 08/02/2020   HCT 42 08/02/2020   PLT 287 08/02/2020   GLUCOSE 85 04/11/2018   CHOL 193 08/02/2020   TRIG 72 08/02/2020   HDL 61 08/02/2020   LDLCALC 119 08/02/2020   ALT 25 08/02/2020   AST 29 08/02/2020   NA 142 08/02/2020   K 4.4 08/02/2020   CL 104 08/02/2020   CREATININE 0.9 08/02/2020   BUN 17 08/02/2020   CO2 25 (A) 08/02/2020   TSH 1.36 04/11/2018   PSA 0.97 09/23/2020   INR 1.2 (H) 11/15/2019   HGBA1C 5.9 08/02/2020    US Abdomen Limited RUQ  Result Date: 12/18/2019 CLINICAL DATA:  59 year old male with history of elevated  liver function tests. EXAM: ULTRASOUND ABDOMEN LIMITED RIGHT UPPER QUADRANT COMPARISON:  No priors. FINDINGS: Gallbladder: No gallstones or wall thickening visualized. No sonographic Murphy sign noted by sonographer. Common bile duct: Diameter: 1.7 mm Liver: No focal lesion identified. Within normal limits in parenchymal echogenicity. Portal vein is patent on color Doppler imaging with normal direction of blood flow towards the liver. Other: None. IMPRESSION: 1. No acute findings. Specifically, no evidence of cholelithiasis,  choledocholithiasis or acute cholecystitis. Electronically Signed   By: Vinnie Langton M.D.   On: 12/18/2019 08:48   DG Knee Complete 4 Views Right  Result Date: 09/23/2020 CLINICAL DATA:  Chronic right knee pain. EXAM: RIGHT KNEE - COMPLETE 4+ VIEW COMPARISON:  July 29, 2020. FINDINGS: No evidence of fracture, dislocation, or joint effusion. No evidence of arthropathy or other focal bone abnormality. Soft tissues are unremarkable. IMPRESSION: Negative. Electronically Signed   By: Marijo Conception M.D.   On: 09/23/2020 15:08    Assessment & Plan:   Noha was seen today for annual exam.  Diagnoses and all orders for this visit:  Thiamine deficiency- His thiamine level is in the low normal range.  I recommend that he restart a thiamine supplement. -     Cancel: CBC with Differential/Platelet; Future -     Vitamin B1; Future -     Vitamin B1 -     thiamine (VITAMIN B-1) 50 MG tablet; Take 1 tablet (50 mg total) by mouth every other day.  Routine general medical examination at a health care facility- Exam completed, labs reviewed -statin therapy is not indicated, vaccines reviewed and updated, cancer screenings are up-to-date, patient education was given. 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  He is getting adequate symptom relief with meloxicam. 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  His LFTs are normal now.  -     Cancel: Basic metabolic panel; Future -     Cancel: Hepatic function panel; Future   I have discontinued Rigoberto Prosperi's thiamine. I am also having him start on thiamine. Additionally, I am having him maintain his multivitamin, Dexilant, and meloxicam.  Meds ordered this encounter  Medications  . thiamine (VITAMIN B-1) 50 MG tablet    Sig: Take 1 tablet (50 mg total) by mouth every other day.    Dispense:  45 tablet    Refill:  1     Follow-up: Return in about 6 months (around 03/23/2021).  Scarlette Calico, MD

## 2020-09-23 NOTE — Patient Instructions (Signed)

## 2020-09-24 ENCOUNTER — Ambulatory Visit (INDEPENDENT_AMBULATORY_CARE_PROVIDER_SITE_OTHER): Payer: Managed Care, Other (non HMO) | Admitting: Otolaryngology

## 2020-09-24 DIAGNOSIS — J310 Chronic rhinitis: Secondary | ICD-10-CM | POA: Diagnosis not present

## 2020-09-24 DIAGNOSIS — H6122 Impacted cerumen, left ear: Secondary | ICD-10-CM | POA: Diagnosis not present

## 2020-09-24 NOTE — Progress Notes (Signed)
HPI: Dantrell Schertzer is a 59 y.o. male who presents for evaluation of sore throat and dizziness.  Apparently he took a flight from [place] to [place] on November 20 and during the flight has some dizziness.  He also had some throat symptoms a couple days after completing the flight.  He took some DayQuil for couple of days and seems to be doing much better but wanted this checked.  He does have history of allergies.  He used to see Dr. Ernesto Rutherford and have his ears cleaned.  Past Medical History:  Diagnosis Date  . Abnormal EKG    work up at [employer] in 2008 (+conc. LVH)  . Allergy   . Anemia   . History of pneumonia   . Positive PPD   . PPD positive 2001   Past Surgical History:  Procedure Laterality Date  . COLONOSCOPY    . DENTAL SURGERY     Social History   Socioeconomic History  . Marital status: Single    Spouse name: Not on file  . Number of children: Not on file  . Years of education: Not on file  . Highest education level: Not on file  Occupational History  . Not on file  Tobacco Use  . Smoking status: Never Smoker  . Smokeless tobacco: Never Used  Substance and Sexual Activity  . Alcohol use: Yes    Comment: 3 drinks containing 0.5 oz of alcholo per month  . Drug use: No  . Sexual activity: Yes  Other Topics Concern  . Not on file  Social History Narrative  . Not on file   Social Determinants of Health   Financial Resource Strain:   . Difficulty of Paying Living Expenses: Not on file  Food Insecurity:   . Worried About Charity fundraiser in the Last Year: Not on file  . Ran Out of Food in the Last Year: Not on file  Transportation Needs:   . Lack of Transportation (Medical): Not on file  . Lack of Transportation (Non-Medical): Not on file  Physical Activity:   . Days of Exercise per Week: Not on file  . Minutes of Exercise per Session: Not on file  Stress:   . Feeling of Stress : Not on file  Social Connections:   . Frequency of Communication  with Friends and Family: Not on file  . Frequency of Social Gatherings with Friends and Family: Not on file  . Attends Religious Services: Not on file  . Active Member of Clubs or Organizations: Not on file  . Attends Archivist Meetings: Not on file  . Marital Status: Not on file   Family History  Problem Relation Age of Onset  . Hypertension Other   . Hyperlipidemia Other   . Colon polyps Sister   . Colon cancer Brother   . Colon polyps Brother   . Cancer Neg Hx   . Diabetes Neg Hx   . Early death Neg Hx   . Hearing loss Neg Hx   . Kidney disease Neg Hx   . Stroke Neg Hx   . Alcohol abuse Neg Hx    No Known Allergies Prior to Admission medications   Medication Sig Start Date End Date Taking? Authorizing Provider  dexlansoprazole (DEXILANT) 60 MG capsule Take 1 capsule (60 mg total) by mouth daily. 30 minutes before a meal 09/25/19   Armbruster, Carlota Raspberry, MD  meloxicam (MOBIC) 15 MG tablet Take 1 tablet (15 mg total) by mouth  daily. 08/15/20 08/15/21  Trula Slade, DPM  Multiple Vitamin (MULTIVITAMIN) tablet Take 1 tablet by mouth daily.      [provider]  thiamine (VITAMIN B-1) 100 MG tablet Take 1 tablet (100 mg total) by mouth daily. 09/26/19   Janith Lima, MD     Positive ROS: Otherwise negative  All other systems have been reviewed and were otherwise negative with the exception of those mentioned in the HPI and as above.  Physical Exam: Constitutional: Alert, well-appearing, no acute distress Ears: External ears without lesions or tenderness.  Right ear canal and right TM are clear.  Left ear canal reveals partial obstruction with wax.  After removing the wax with a curette the ear canal and TM are otherwise clear.  Dix-Hallpike testing revealed no evidence of BPPV.  Hearing screening with the 512 1024 tuning fork revealed symmetric hearing. Nasal: External nose without lesions. Septum midline with mild rhinitis and no signs of infection..   Clear middle meatus bilaterally. Oral: Lips and gums without lesions. Tongue and palate mucosa without lesions. Posterior oropharynx clear. Neck: No palpable adenopathy or masses Respiratory: Breathing comfortably  Skin: No facial/neck lesions or rash noted.  Cerumen impaction removal  Date/Time: 09/24/2020 5:57 PM Performed by: Rozetta Nunnery, MD Authorized by: Rozetta Nunnery, MD   Consent:    Consent obtained:  Verbal   Consent given by:  Patient   Risks discussed:  Pain and bleeding Procedure details:    Location:  L ear   Procedure type: curette   Post-procedure details:    Inspection:  TM intact and canal normal   Hearing quality:  Improved   Patient tolerance of procedure:  Tolerated well, no immediate complications Comments:     Ear canals and TMs are otherwise clear.    Assessment: Wax buildup on the left side. Symptoms probably related to allergies and eustachian tube dysfunction although he is doing much better today.  Plan: Reassured him of normal exam today.  The wax was cleaned from the left side. He will follow-up as needed.  Radene Journey, MD

## 2020-09-26 ENCOUNTER — Ambulatory Visit (INDEPENDENT_AMBULATORY_CARE_PROVIDER_SITE_OTHER): Payer: Managed Care, Other (non HMO) | Admitting: Podiatry

## 2020-09-26 ENCOUNTER — Other Ambulatory Visit: Payer: Self-pay

## 2020-09-26 DIAGNOSIS — M722 Plantar fascial fibromatosis: Secondary | ICD-10-CM

## 2020-09-26 DIAGNOSIS — M7732 Calcaneal spur, left foot: Secondary | ICD-10-CM

## 2020-09-26 NOTE — Patient Instructions (Signed)
For instructions on how to put on your Night Splint, please visit [email or web address]   Plantar Fasciitis (Heel Spur Syndrome) with Rehab The plantar fascia is a fibrous, ligament-like, soft-tissue structure that spans the bottom of the foot. Plantar fasciitis is a condition that causes pain in the foot due to inflammation of the tissue. SYMPTOMS   Pain and tenderness on the underneath side of the foot.  Pain that worsens with standing or walking. CAUSES  Plantar fasciitis is caused by irritation and injury to the plantar fascia on the underneath side of the foot. Common mechanisms of injury include:  Direct trauma to bottom of the foot.  Damage to a small nerve that runs under the foot where the main fascia attaches to the heel bone.  Stress placed on the plantar fascia due to bone spurs. RISK INCREASES WITH:   Activities that place stress on the plantar fascia (running, jumping, pivoting, or cutting).  Poor strength and flexibility.  Improperly fitted shoes.  Tight calf muscles.  Flat feet.  Failure to warm-up properly before activity.  Obesity. PREVENTION  Warm up and stretch properly before activity.  Allow for adequate recovery between workouts.  Maintain physical fitness:  Strength, flexibility, and endurance.  Cardiovascular fitness.  Maintain a health body weight.  Avoid stress on the plantar fascia.  Wear properly fitted shoes, including arch supports for individuals who have flat feet.  PROGNOSIS  If treated properly, then the symptoms of plantar fasciitis usually resolve without surgery. However, occasionally surgery is necessary.  RELATED COMPLICATIONS   Recurrent symptoms that may result in a chronic condition.  Problems of the lower back that are caused by compensating for the injury, such as limping.  Pain or weakness of the foot during push-off following surgery.  Chronic inflammation, scarring, and partial or complete fascia tear,  occurring more often from repeated injections.  TREATMENT  Treatment initially involves the use of ice and medication to help reduce pain and inflammation. The use of strengthening and stretching exercises may help reduce pain with activity, especially stretches of the Achilles tendon. These exercises may be performed at home or with a therapist. Your caregiver may recommend that you use heel cups of arch supports to help reduce stress on the plantar fascia. Occasionally, corticosteroid injections are given to reduce inflammation. If symptoms persist for greater than 6 months despite non-surgical (conservative), then surgery may be recommended.   MEDICATION   If pain medication is necessary, then nonsteroidal anti-inflammatory medications, such as aspirin and ibuprofen, or other minor pain relievers, such as acetaminophen, are often recommended.  Do not take pain medication within 7 days before surgery.  Prescription pain relievers may be given if deemed necessary by your caregiver. Use only as directed and only as much as you need.  Corticosteroid injections may be given by your caregiver. These injections should be reserved for the most serious cases, because they may only be given a certain number of times.  HEAT AND COLD  Cold treatment (icing) relieves pain and reduces inflammation. Cold treatment should be applied for 10 to 15 minutes every 2 to 3 hours for inflammation and pain and immediately after any activity that aggravates your symptoms. Use ice packs or massage the area with a piece of ice (ice massage).  Heat treatment may be used prior to performing the stretching and strengthening activities prescribed by your caregiver, physical therapist, or athletic trainer. Use a heat pack or soak the injury in warm water.  [place]  IF:  Treatment seems to offer no benefit, or the condition worsens.  Any medications produce adverse side effects.  EXERCISES- RANGE OF  MOTION (ROM) AND STRETCHING EXERCISES - Plantar Fasciitis (Heel Spur Syndrome) These exercises may help you when beginning to rehabilitate your injury. Your symptoms may resolve with or without further involvement from your physician, physical therapist or athletic trainer. While completing these exercises, remember:   Restoring tissue flexibility helps normal motion to return to the joints. This allows healthier, less painful movement and activity.  An effective stretch should be held for at least 30 seconds.  A stretch should never be painful. You should only feel a gentle lengthening or release in the stretched tissue.  RANGE OF MOTION - Toe Extension, Flexion  Sit with your right / left leg crossed over your opposite knee.  Grasp your toes and gently pull them back toward the top of your foot. You should feel a stretch on the bottom of your toes and/or foot.  Hold this stretch for 10 seconds.  Now, gently pull your toes toward the bottom of your foot. You should feel a stretch on the top of your toes and or foot.  Hold this stretch for 10 seconds. Repeat  times. Complete this stretch 3 times per day.   RANGE OF MOTION - Ankle Dorsiflexion, Active Assisted  Remove shoes and sit on a chair that is preferably not on a carpeted surface.  Place right / left foot under knee. Extend your opposite leg for support.  Keeping your heel down, slide your right / left foot back toward the chair until you feel a stretch at your ankle or calf. If you do not feel a stretch, slide your bottom forward to the edge of the chair, while still keeping your heel down.  Hold this stretch for 10 seconds. Repeat 3 times. Complete this stretch 2 times per day.   STRETCH  Gastroc, Standing  Place hands on wall.  Extend right / left leg, keeping the front knee somewhat bent.  Slightly point your toes inward on your back foot.  Keeping your right / left heel on the floor and your knee straight, shift  your weight toward the wall, not allowing your back to arch.  You should feel a gentle stretch in the right / left calf. Hold this position for 10 seconds. Repeat 3 times. Complete this stretch 2 times per day.  STRETCH  Soleus, Standing  Place hands on wall.  Extend right / left leg, keeping the other knee somewhat bent.  Slightly point your toes inward on your back foot.  Keep your right / left heel on the floor, bend your back knee, and slightly shift your weight over the back leg so that you feel a gentle stretch deep in your back calf.  Hold this position for 10 seconds. Repeat 3 times. Complete this stretch 2 times per day.  STRETCH  Gastrocsoleus, Standing  Note: This exercise can place a lot of stress on your foot and ankle. Please complete this exercise only if specifically instructed by your caregiver.   Place the ball of your right / left foot on a step, keeping your other foot firmly on the same step.  Hold on to the wall or a rail for balance.  Slowly lift your other foot, allowing your body weight to press your heel down over the edge of the step.  You should feel a stretch in your right / left calf.  Hold this position  for 10 seconds.  Repeat this exercise with a slight bend in your right / left knee. Repeat 3 times. Complete this stretch 2 times per day.   STRENGTHENING EXERCISES - Plantar Fasciitis (Heel Spur Syndrome)  These exercises may help you when beginning to rehabilitate your injury. They may resolve your symptoms with or without further involvement from your physician, physical therapist or athletic trainer. While completing these exercises, remember:   Muscles can gain both the endurance and the strength needed for everyday activities through controlled exercises.  Complete these exercises as instructed by your physician, physical therapist or athletic trainer. Progress the resistance and repetitions only as guided.  STRENGTH - Towel Curls  Sit in  a chair positioned on a non-carpeted surface.  Place your foot on a towel, keeping your heel on the floor.  Pull the towel toward your heel by only curling your toes. Keep your heel on the floor. Repeat 3 times. Complete this exercise 2 times per day.  STRENGTH - Ankle Inversion  Secure one end of a rubber exercise band/tubing to a fixed object (table, pole). Loop the other end around your foot just before your toes.  Place your fists between your knees. This will focus your strengthening at your ankle.  Slowly, pull your big toe up and in, making sure the band/tubing is positioned to resist the entire motion.  Hold this position for 10 seconds.  Have your muscles resist the band/tubing as it slowly pulls your foot back to the starting position. Repeat 3 times. Complete this exercises 2 times per day.  Document Released: 10/12/2005 Document Revised: 01/04/2012 Document Reviewed: 01/24/2009 [facility] Patient Information 2014 [place], [place].

## 2020-09-27 LAB — VITAMIN B1: Vitamin B1 (Thiamine): 8 nmol/L (ref 8–30)

## 2020-09-27 MED ORDER — VITAMIN B-1 50 MG PO TABS: 50.0000 mg | ORAL_TABLET | ORAL | 1 refills | Status: DC

## 2020-09-30 NOTE — Progress Notes (Addendum)
Subjective: 59 year old male presents the office today for follow-up evaluation left heel pain, plantar fasciitis.  Overall he states he feels a lot better.  He has no concerns today.  He still been doing the stretching, icing daily.  No recent injury or falls or changes.  To do some discomfort when he first gets up.  Gets better with activity. Denies any systemic complaints such as fevers, chills, nausea, vomiting. No acute changes since last appointment, and no other complaints at this time.   Objective: AAO x3, NAD DP/PT pulses palpable bilaterally, CRT less than 3 seconds There is minimal discomfort of elevation of the plantar medial tubercle of the calcaneus at the insertion of plantar fascia of the left foot.  There is no pain with lateral compression of calcaneus.  There is no edema, erythema.  No pain with Achilles tendon.  MMT 5/5. No pain with calf compression, swelling, warmth, erythema  Assessment: Left heel pain, plantar fasciitis- improving   Plan: -All treatment options discussed with the patient including all alternatives, risks, complications.  -He is doing much better.  We held off on another steroid injection today.  Continue stretching, icing daily.  Continue with supportive shoes, orthotics. -Night splint dispensed  -Patient encouraged to call the office with any questions, concerns, change in symptoms.   Trula Slade DPM

## 2020-12-13 IMAGING — DX DG KNEE COMPLETE 4+V*R*
4 series · 4 of 4 positions shown · non-contrast
Comparison: July 29, 2020.

CLINICAL DATA: Chronic right knee pain.

EXAM:
RIGHT KNEE - COMPLETE 4+ VIEW

[knee ap]
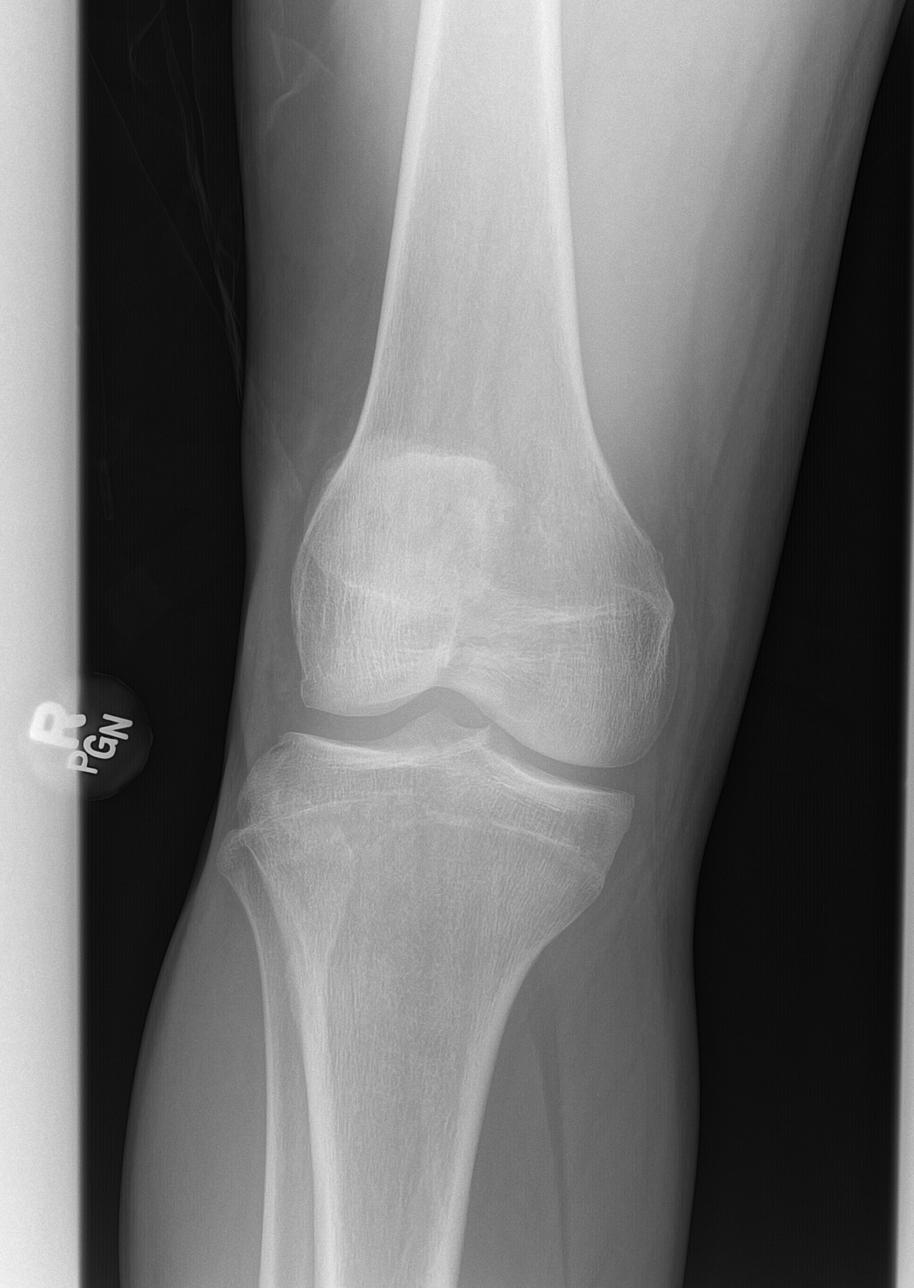

[knee obl (1 of 2)]
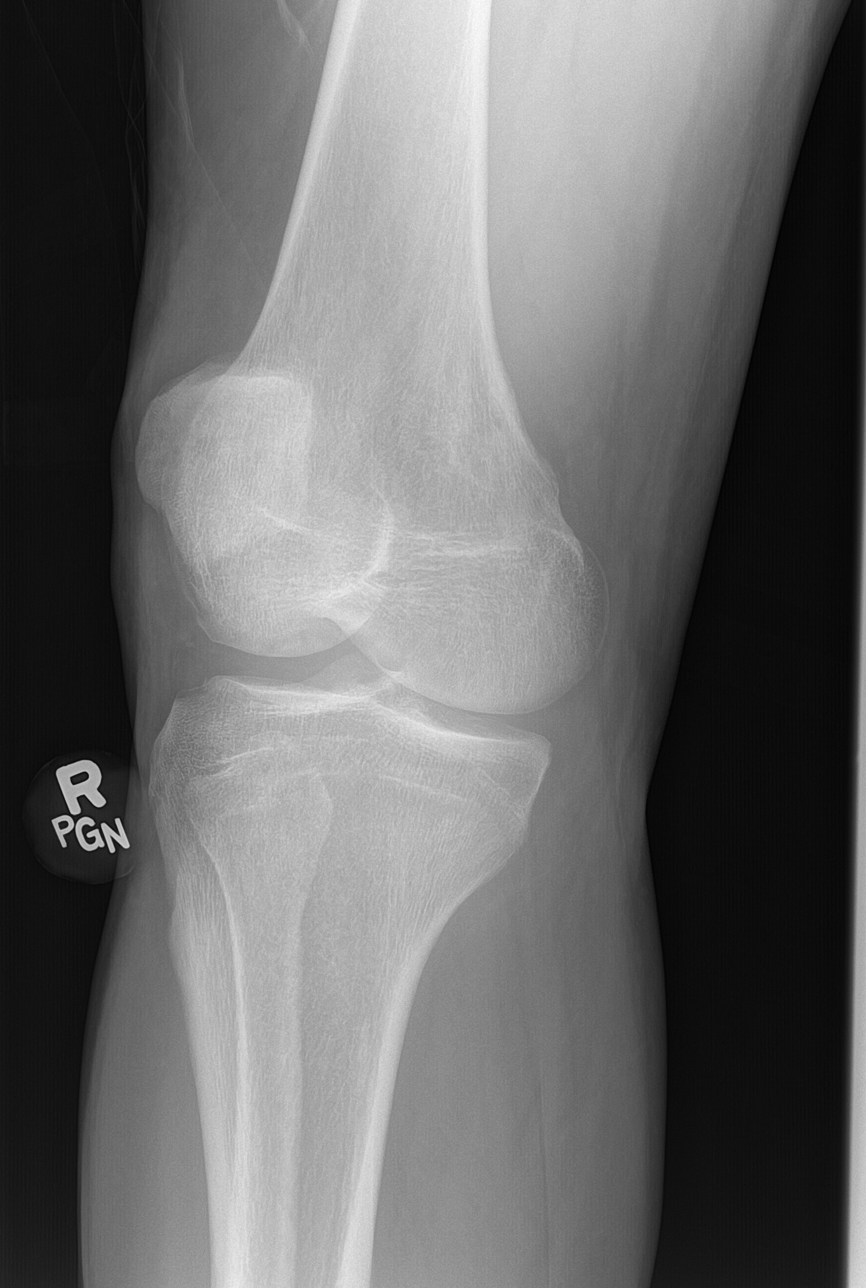

[knee obl (2 of 2)]
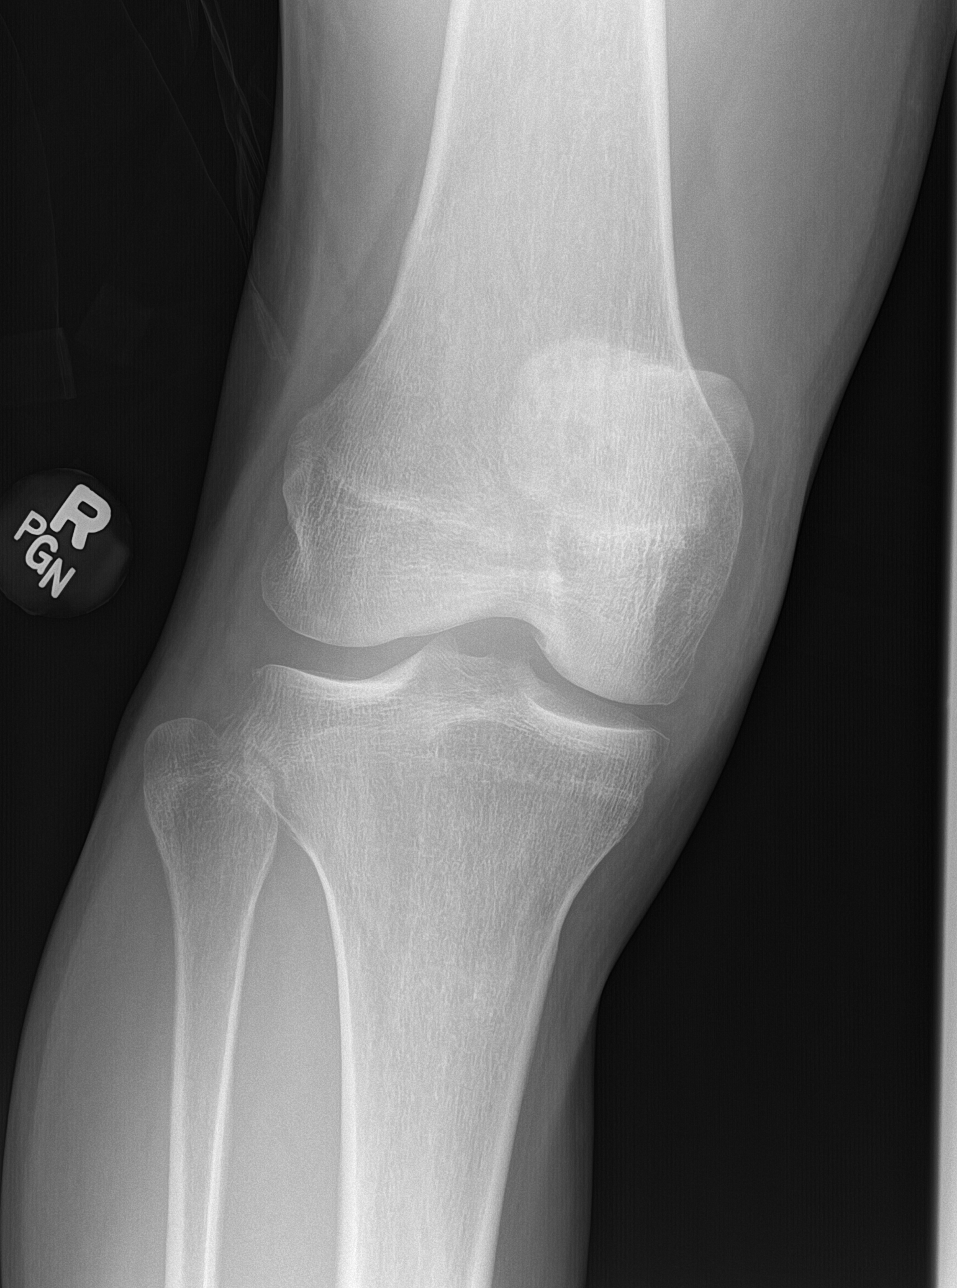

[knee lat]
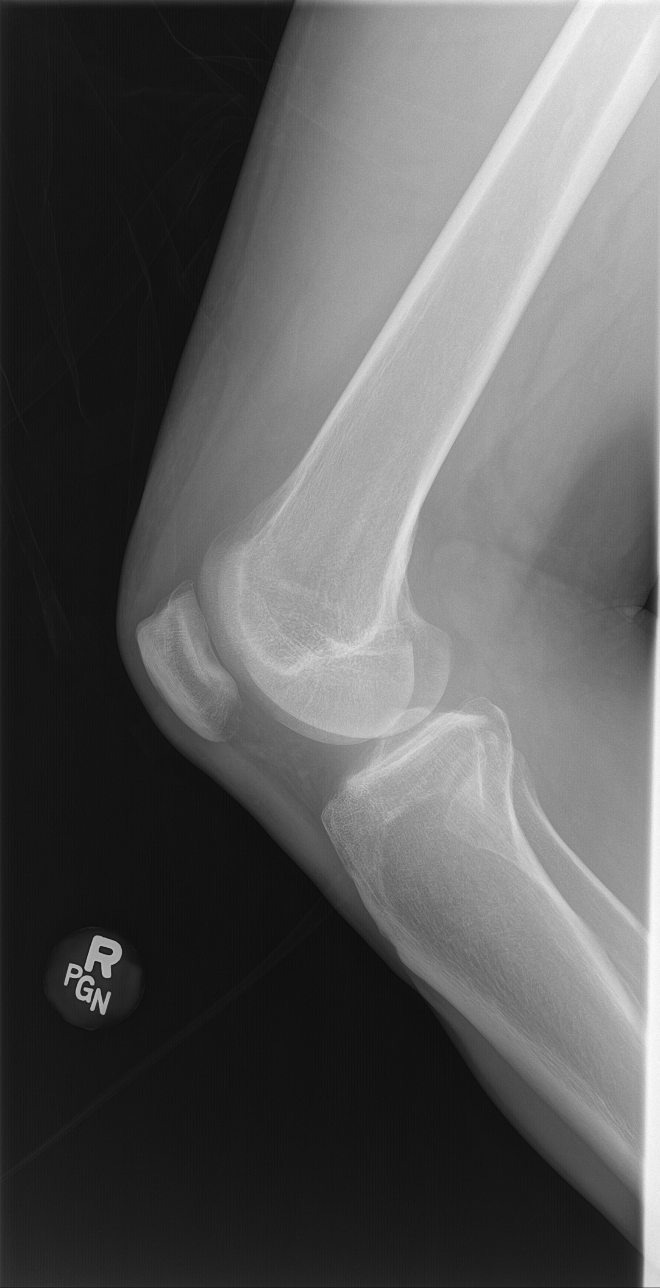

[4 of 4 positions shown; findings below may reference images not displayed]

FINDINGS: No evidence of fracture, dislocation, or joint effusion. No evidence
of arthropathy or other focal bone abnormality. Soft tissues are
unremarkable.
IMPRESSION: Negative.

## 2021-05-13 LAB — BASIC METABOLIC PANEL
BUN: 19 (ref 4–21)
CO2: 26 — AB (ref 13–22)
Chloride: 103 (ref 99–108)
Creatinine: 1 (ref 0.6–1.3)
Glucose: 89
Potassium: 4.6 (ref 3.4–5.3)
Sodium: 141 (ref 137–147)

## 2021-05-13 LAB — COMPREHENSIVE METABOLIC PANEL
Albumin: 4.4 (ref 3.5–5.0)
Calcium: 9.4 (ref 8.7–10.7)
Globulin: 2.8

## 2021-05-13 LAB — HEPATIC FUNCTION PANEL
ALT: 16 (ref 10–40)
AST: 23 (ref 14–40)

## 2021-06-12 ENCOUNTER — Encounter: Payer: Self-pay | Admitting: Internal Medicine

## 2021-06-12 ENCOUNTER — Ambulatory Visit (INDEPENDENT_AMBULATORY_CARE_PROVIDER_SITE_OTHER): Payer: Managed Care, Other (non HMO) | Admitting: Internal Medicine

## 2021-06-12 ENCOUNTER — Other Ambulatory Visit: Payer: Self-pay

## 2021-06-12 VITALS — BP 118/78 | HR 69 | Temp 98.6°F | Ht 68.0 in | Wt 157.2 lb

## 2021-06-12 DIAGNOSIS — K2101 Gastro-esophageal reflux disease with esophagitis, with bleeding: Secondary | ICD-10-CM | POA: Diagnosis not present

## 2021-06-12 DIAGNOSIS — R8290 Unspecified abnormal findings in urine: Secondary | ICD-10-CM | POA: Diagnosis not present

## 2021-06-12 DIAGNOSIS — E519 Thiamine deficiency, unspecified: Secondary | ICD-10-CM

## 2021-06-12 LAB — URINALYSIS, ROUTINE W REFLEX MICROSCOPIC
Bilirubin Urine: NEGATIVE
Hgb urine dipstick: NEGATIVE
Ketones, ur: NEGATIVE
Leukocytes,Ua: NEGATIVE
Nitrite: NEGATIVE
RBC / HPF: NONE SEEN (ref 0–?)
Specific Gravity, Urine: 1.025 (ref 1.000–1.030)
Total Protein, Urine: NEGATIVE
Urine Glucose: NEGATIVE
Urobilinogen, UA: 0.2 (ref 0.0–1.0)
pH: 6 (ref 5.0–8.0)

## 2021-06-12 MED ORDER — VITAMIN B-1 50 MG PO TABS: 50.0000 mg | ORAL_TABLET | ORAL | 1 refills | Status: DC

## 2021-06-12 NOTE — Progress Notes (Signed)
Subjective:  Patient ID: Dominic Coleman, male    DOB: 09-Jan-1961  Age: 60 y.o. MRN: [id number]  CC: Gastroesophageal Reflux  This visit occurred during the SARS-CoV-2 public health emergency.  Safety protocols were in place, including screening questions prior to the visit, additional usage of staff PPE, and extensive cleaning of exam room while observing appropriate contact time as indicated for disinfecting solutions.    HPI Dominic Coleman presents for f/up -  He recently had a physical done elsewhere.  His urinalysis revealed ketones and protein.  His BUN was slightly elevated. The creatinine were normal.  He was asked to see me to have it repeated.  He feels well today and offers no complaints.  He has stopped taking the PPI and the NSAID.  Outpatient Medications Prior to Visit  Medication Sig Dispense Refill   Multiple Vitamin (MULTIVITAMIN) tablet Take 1 tablet by mouth daily.   (Patient not taking: Reported on 06/12/2021)     dexlansoprazole (DEXILANT) 60 MG capsule Take 1 capsule (60 mg total) by mouth daily. 30 minutes before a meal (Patient not taking: Reported on 06/12/2021) 90 capsule 1   meloxicam (MOBIC) 15 MG tablet Take 1 tablet (15 mg total) by mouth daily. (Patient not taking: Reported on 06/12/2021) 30 tablet 0   thiamine (VITAMIN B-1) 50 MG tablet Take 1 tablet (50 mg total) by mouth every other day. (Patient not taking: Reported on 06/12/2021) 45 tablet 1   No facility-administered medications prior to visit.    ROS Review of Systems  Constitutional:  Negative for diaphoresis and fatigue.  HENT: Negative.    Eyes: Negative.   Respiratory:  Negative for cough, chest tightness, shortness of breath and wheezing.   Cardiovascular:  Negative for chest pain, palpitations and leg swelling.  Gastrointestinal:  Negative for abdominal pain, diarrhea, nausea and vomiting.  Endocrine: Negative.   Genitourinary: Negative.  Negative for difficulty urinating, dysuria and hematuria.   Musculoskeletal: Negative.  Negative for arthralgias and myalgias.  Skin: Negative.  Negative for color change.  Neurological:  Negative for dizziness, weakness, light-headedness and headaches.  Hematological:  Negative for adenopathy. Does not bruise/bleed easily.  Psychiatric/Behavioral: Negative.     Objective:  BP 118/78   Pulse 69   Temp 98.6 F (37 C) (Oral)   Ht 5' 8"  (1.727 m)   Wt 157 lb 3.2 oz (71.3 kg)   SpO2 97%   BMI 23.90 kg/m   BP Readings from Last 3 Encounters:  06/12/21 118/78  09/23/20 136/82  07/29/20 126/80    Wt Readings from Last 3 Encounters:  06/12/21 157 lb 3.2 oz (71.3 kg)  09/23/20 157 lb (71.2 kg)  07/29/20 156 lb (70.8 kg)    Physical Exam Vitals reviewed.  HENT:     Nose: Nose normal.     Mouth/Throat:     Mouth: Mucous membranes are moist.  Eyes:     Conjunctiva/sclera: Conjunctivae normal.  Cardiovascular:     Rate and Rhythm: Normal rate and regular rhythm.     Heart sounds: No murmur heard. Pulmonary:     Effort: Pulmonary effort is normal.     Breath sounds: No stridor. No wheezing, rhonchi or rales.  Abdominal:     General: Abdomen is flat.     Palpations: There is no mass.     Tenderness: There is no abdominal tenderness. There is no guarding.     Hernia: No hernia is present.  Musculoskeletal:        General:  Normal range of motion.     Cervical back: Neck supple.     Right lower leg: No edema.     Left lower leg: No edema.  Skin:    General: Skin is warm and dry.  Neurological:     General: No focal deficit present.     Mental Status: He is alert.    Lab Results  Component Value Date   WBC 3.0 08/02/2020   HGB 13.5 08/02/2020   HCT 42 08/02/2020   PLT 287 08/02/2020   GLUCOSE 85 04/11/2018   CHOL 193 08/02/2020   TRIG 72 08/02/2020   HDL 61 08/02/2020   LDLCALC 119 08/02/2020   ALT 16 05/13/2021   AST 23 05/13/2021   NA 141 05/13/2021   K 4.6 05/13/2021   CL 103 05/13/2021   CREATININE 1.0 05/13/2021    BUN 19 05/13/2021   CO2 26 (A) 05/13/2021   TSH 1.36 04/11/2018   PSA 0.97 09/23/2020   INR 1.2 (H) 11/15/2019   HGBA1C 5.9 08/02/2020    US Abdomen Limited RUQ  Result Date: 12/18/2019 CLINICAL DATA:  60 year old male with history of elevated liver function tests. EXAM: ULTRASOUND ABDOMEN LIMITED RIGHT UPPER QUADRANT COMPARISON:  No priors. FINDINGS: Gallbladder: No gallstones or wall thickening visualized. No sonographic Murphy sign noted by sonographer. Common bile duct: Diameter: 1.7 mm Liver: No focal lesion identified. Within normal limits in parenchymal echogenicity. Portal vein is patent on color Doppler imaging with normal direction of blood flow towards the liver. Other: None. IMPRESSION: 1. No acute findings. Specifically, no evidence of cholelithiasis, choledocholithiasis or acute cholecystitis. Electronically Signed   By: Vinnie Langton M.D.   On: 12/18/2019 08:48    Assessment & Plan:   Dominic Coleman was seen today for gastroesophageal reflux.  Diagnoses and all orders for this visit:  Urine abnormality- His UA is normal now. -     Urinalysis, Routine w reflex microscopic; Future -     Urinalysis, Routine w reflex microscopic  Thiamine deficiency -     thiamine (VITAMIN B-1) 50 MG tablet; Take 1 tablet (50 mg total) by mouth every other day.  Gastroesophageal reflux disease with esophagitis and hemorrhage- This has resolved.  I have discontinued Thomasene Mohair Dexilant and meloxicam. I am also having him maintain his multivitamin and thiamine.  Meds ordered this encounter  Medications   thiamine (VITAMIN B-1) 50 MG tablet    Sig: Take 1 tablet (50 mg total) by mouth every other day.    Dispense:  45 tablet    Refill:  1     Follow-up: Return in about 6 months (around 12/13/2021).  Scarlette Calico, MD

## 2021-06-12 NOTE — Patient Instructions (Signed)
[employer] Current Therapy 2021 (pp. 213-216). [place], [place]: Elsevier.">  Gastroesophageal Reflux Disease, Adult Gastroesophageal reflux (GER) happens when acid from the stomach flows up into the tube that connects the mouth and the stomach (esophagus). Normally, food travels down the esophagus and stays in the stomach to be digested. However, when a person has GER, food and stomach acid sometimes move back up into the esophagus. If this becomes a more serious problem, the person may be diagnosed with a disease called gastroesophageal reflux disease (GERD). GERD occurs when the reflux: Happens often. Causes frequent or severe symptoms. Causes problems such as damage to the esophagus. When stomach acid comes in contact with the esophagus, the acid may cause inflammation in the esophagus. Over time, GERD may create small holes (ulcers) in the lining of the esophagus. What are the causes? This condition is caused by a problem with the muscle between the esophagus and the stomach (lower esophageal sphincter, or LES). Normally, the LES muscle closes after food passes through the esophagus to the stomach. When the LES is weakened or abnormal, it does not close properly, and that allows food and stomach acid to go back up into theesophagus. The LES can be weakened by certain dietary substances, medicines, and medical conditions, including: Tobacco use. Pregnancy. Having a hiatal hernia. Alcohol use. Certain foods and beverages, such as coffee, chocolate, onions, and peppermint. What increases the risk? You are more likely to develop this condition if you: Have an increased body weight. Have a connective tissue disorder. Take NSAIDs, such as ibuprofen. What are the signs or symptoms? Symptoms of this condition include: Heartburn. Difficult or painful swallowing and the feeling of having a lump in the throat. A bitter taste in the mouth. Bad breath and having a large amount of saliva. Having an  upset or bloated stomach and belching. Chest pain. Different conditions can cause chest pain. Make sure you see your health care provider if you experience chest pain. Shortness of breath or wheezing. Ongoing (chronic) cough or a nighttime cough. Wearing away of tooth enamel. Weight loss. How is this diagnosed? This condition may be diagnosed based on a medical history and a physical exam. To determine if you have mild or severe GERD, your health care provider may also monitor how you respond to treatment. You may also have tests, including: A test to examine your stomach and esophagus with a small camera (endoscopy). A test that measures the acidity level in your esophagus. A test that measures how much pressure is on your esophagus. A barium swallow or modified barium swallow test to show the shape, size, and functioning of your esophagus. How is this treated? Treatment for this condition may vary depending on how severe your symptoms are. Your health care provider may recommend: Changes to your diet. Medicine. Surgery. The goal of treatment is to help relieve your symptoms and to preventcomplications. Follow these instructions at home: Eating and drinking  Follow a diet as recommended by your health care provider. This may involve avoiding foods and drinks such as: Coffee and tea, with or without caffeine. Drinks that contain alcohol. Energy drinks and sports drinks. Carbonated drinks or sodas. Chocolate and cocoa. Peppermint and mint flavorings. Garlic and onions. Horseradish. Spicy and acidic foods, including peppers, chili powder, curry powder, vinegar, hot sauces, and barbecue sauce. Citrus fruit juices and citrus fruits, such as oranges, lemons, and limes. Tomato-based foods, such as red sauce, chili, salsa, and pizza with red sauce. Fried and fatty foods, such as  donuts, french fries, potato chips, and high-fat dressings. High-fat meats, such as hot dogs and fatty cuts of  red and white meats, such as rib eye steak, sausage, ham, and bacon. High-fat dairy items, such as whole milk, butter, and cream cheese. Eat small, frequent meals instead of large meals. Avoid drinking large amounts of liquid with your meals. Avoid eating meals during the 2-3 hours before bedtime. Avoid lying down right after you eat. Do not exercise right after you eat.  Lifestyle  Do not use any products that contain nicotine or tobacco. These products include cigarettes, chewing tobacco, and vaping devices, such as e-cigarettes. If you need help quitting, ask your health care provider. Try to reduce your stress by using methods such as yoga or meditation. If you need help reducing stress, ask your health care provider. If you are overweight, reduce your weight to an amount that is healthy for you. Ask your health care provider for guidance about a safe weight loss goal.  General instructions Pay attention to any changes in your symptoms. Take over-the-counter and prescription medicines only as told by your health care provider. Do not take aspirin, ibuprofen, or other NSAIDs unless your health care provider told you to take these medicines. Wear loose-fitting clothing. Do not wear anything tight around your waist that causes pressure on your abdomen. Raise (elevate) the head of your bed about 6 inches (15 cm). You can use a wedge to do this. Avoid bending over if this makes your symptoms worse. Keep all follow-up visits. This is important. Contact a health care provider if: You have: New symptoms. Unexplained weight loss. Difficulty swallowing or it hurts to swallow. Wheezing or a persistent cough. A hoarse voice. Your symptoms do not improve with treatment. Get help right away if: You have sudden pain in your arms, neck, jaw, teeth, or back. You suddenly feel sweaty, dizzy, or light-headed. You have chest pain or shortness of breath. You vomit and the vomit is green, yellow, or  black, or it looks like blood or coffee grounds. You faint. You have stool that is red, bloody, or black. You cannot swallow, drink, or eat. These symptoms may represent a serious problem that is an emergency. Do not wait to see if the symptoms will go away. Get medical help right away. Call your local emergency services (911 in the [place] Do not drive yourself to the hospital. Summary Gastroesophageal reflux happens when acid from the stomach flows up into the esophagus. GERD is a disease in which the reflux happens often, causes frequent or severe symptoms, or causes problems such as damage to the esophagus. Treatment for this condition may vary depending on how severe your symptoms are. Your health care provider may recommend diet and lifestyle changes, medicine, or surgery. Contact a health care provider if you have new or worsening symptoms. Take over-the-counter and prescription medicines only as told by your health care provider. Do not take aspirin, ibuprofen, or other NSAIDs unless your health care provider told you to do so. Keep all follow-up visits as told by your health care provider. This is important. This information is not intended to replace advice given to you by your health care provider. Make sure you discuss any questions you have with your healthcare provider. Document Revised: 04/22/2020 Document Reviewed: 04/22/2020 Elsevier Patient Education  [place].

## 2022-09-07 ENCOUNTER — Telehealth: Payer: Self-pay | Admitting: Internal Medicine

## 2022-09-07 NOTE — Telephone Encounter (Signed)
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 Pt says to let his PCP know of his  symptoms that occurred on 08/31/22. Pt did not want to schedule for tomorrow. Pt said he does not feel symptoms now and does not feel it is urgent.   BP has been good.  Last night reading:   119/75 sitting. Manual pulse 56 x 1.  This morning: 130/33 sitting.  128/81 now while standing.  Pt request appt before Thanksgiving, any day except tomorrow 09/08/22.

## 2022-09-28 ENCOUNTER — Ambulatory Visit (INDEPENDENT_AMBULATORY_CARE_PROVIDER_SITE_OTHER): Payer: 59 | Admitting: Internal Medicine

## 2022-09-28 ENCOUNTER — Encounter: Payer: Self-pay | Admitting: Internal Medicine

## 2022-09-28 ENCOUNTER — Ambulatory Visit (INDEPENDENT_AMBULATORY_CARE_PROVIDER_SITE_OTHER): Payer: 59

## 2022-09-28 VITALS — BP 138/82 | HR 70 | Temp 98.6°F | Resp 16 | Ht 68.0 in | Wt 160.0 lb

## 2022-09-28 DIAGNOSIS — E785 Hyperlipidemia, unspecified: Secondary | ICD-10-CM

## 2022-09-28 DIAGNOSIS — R7611 Nonspecific reaction to tuberculin skin test without active tuberculosis: Secondary | ICD-10-CM

## 2022-09-28 DIAGNOSIS — R9431 Abnormal electrocardiogram [ECG] [EKG]: Secondary | ICD-10-CM | POA: Diagnosis not present

## 2022-09-28 DIAGNOSIS — E519 Thiamine deficiency, unspecified: Secondary | ICD-10-CM | POA: Diagnosis not present

## 2022-09-28 DIAGNOSIS — R002 Palpitations: Secondary | ICD-10-CM | POA: Insufficient documentation

## 2022-09-28 DIAGNOSIS — Z23 Encounter for immunization: Secondary | ICD-10-CM | POA: Diagnosis not present

## 2022-09-28 DIAGNOSIS — M79651 Pain in right thigh: Secondary | ICD-10-CM

## 2022-09-28 DIAGNOSIS — R0609 Other forms of dyspnea: Secondary | ICD-10-CM | POA: Insufficient documentation

## 2022-09-28 DIAGNOSIS — Z0000 Encounter for general adult medical examination without abnormal findings: Secondary | ICD-10-CM

## 2022-09-28 DIAGNOSIS — Z125 Encounter for screening for malignant neoplasm of prostate: Secondary | ICD-10-CM

## 2022-09-28 DIAGNOSIS — K2101 Gastro-esophageal reflux disease with esophagitis, with bleeding: Secondary | ICD-10-CM | POA: Diagnosis not present

## 2022-09-28 DIAGNOSIS — I119 Hypertensive heart disease without heart failure: Secondary | ICD-10-CM

## 2022-09-28 LAB — CBC WITH DIFFERENTIAL/PLATELET
Basophils Absolute: 0 10*3/uL (ref 0.0–0.1)
Basophils Relative: 0.4 % (ref 0.0–3.0)
Eosinophils Absolute: 0.1 10*3/uL (ref 0.0–0.7)
Eosinophils Relative: 2.4 % (ref 0.0–5.0)
HCT: 39.2 % (ref 39.0–52.0)
Hemoglobin: 13.4 g/dL (ref 13.0–17.0)
Lymphocytes Relative: 32.8 % (ref 12.0–46.0)
Lymphs Abs: 0.8 10*3/uL (ref 0.7–4.0)
MCHC: 34.3 g/dL (ref 30.0–36.0)
MCV: 93.1 fl (ref 78.0–100.0)
Monocytes Absolute: 0.3 10*3/uL (ref 0.1–1.0)
Monocytes Relative: 13.1 % — ABNORMAL HIGH (ref 3.0–12.0)
Neutro Abs: 1.3 10*3/uL — ABNORMAL LOW (ref 1.4–7.7)
Neutrophils Relative %: 51.3 % (ref 43.0–77.0)
Platelets: 278 10*3/uL (ref 150.0–400.0)
RBC: 4.21 Mil/uL — ABNORMAL LOW (ref 4.22–5.81)
RDW: 12.8 % (ref 11.5–15.5)
WBC: 2.6 10*3/uL — ABNORMAL LOW (ref 4.0–10.5)

## 2022-09-28 LAB — BRAIN NATRIURETIC PEPTIDE: Pro B Natriuretic peptide (BNP): 19 pg/mL (ref 0.0–100.0)

## 2022-09-28 LAB — HEPATIC FUNCTION PANEL
ALT: 30 U/L (ref 0–53)
AST: 32 U/L (ref 0–37)
Albumin: 4.2 g/dL (ref 3.5–5.2)
Alkaline Phosphatase: 80 U/L (ref 39–117)
Bilirubin, Direct: 0.1 mg/dL (ref 0.0–0.3)
Total Bilirubin: 0.4 mg/dL (ref 0.2–1.2)
Total Protein: 7 g/dL (ref 6.0–8.3)

## 2022-09-28 LAB — LIPID PANEL
Cholesterol: 188 mg/dL (ref 0–200)
HDL: 63.8 mg/dL (ref >39.00)
LDL Cholesterol: 111 mg/dL — ABNORMAL HIGH (ref 0–99)
NonHDL: 123.74
Total CHOL/HDL Ratio: 3
Triglycerides: 65 mg/dL (ref 0.0–149.0)
VLDL: 13 mg/dL (ref 0.0–40.0)

## 2022-09-28 LAB — BASIC METABOLIC PANEL
BUN: 19 mg/dL (ref 6–23)
CO2: 30 mEq/L (ref 19–32)
Calcium: 9.1 mg/dL (ref 8.4–10.5)
Chloride: 105 mEq/L (ref 96–112)
Creatinine, Ser: 0.93 mg/dL (ref 0.40–1.50)
GFR: 89.76 mL/min (ref >60.00)
Glucose, Bld: 91 mg/dL (ref 70–99)
Potassium: 4 mEq/L (ref 3.5–5.1)
Sodium: 141 mEq/L (ref 135–145)

## 2022-09-28 LAB — TROPONIN I (HIGH SENSITIVITY): High Sens Troponin I: 4 ng/L (ref 2–17)

## 2022-09-28 LAB — PSA: PSA: 0.81 ng/mL (ref 0.10–4.00)

## 2022-09-28 LAB — TSH: TSH: 1.87 u[IU]/mL (ref 0.35–5.50)

## 2022-09-28 NOTE — Patient Instructions (Signed)
Health Maintenance, Male Adopting a healthy lifestyle and getting preventive care are important in promoting health and wellness. Ask your health care provider about: The right schedule for you to have regular tests and exams. Things you can do on your own to prevent diseases and keep yourself healthy. What should I know about diet, weight, and exercise? Eat a healthy diet  Eat a diet that includes plenty of vegetables, fruits, low-fat dairy products, and lean protein. Do not eat a lot of foods that are high in solid fats, added sugars, or sodium. Maintain a healthy weight Body mass index (BMI) is a measurement that can be used to identify possible weight problems. It estimates body fat based on height and weight. Your health care provider can help determine your BMI and help you achieve or maintain a healthy weight. Get regular exercise Get regular exercise. This is one of the most important things you can do for your health. Most adults should: Exercise for at least 150 minutes each week. The exercise should increase your heart rate and make you sweat (moderate-intensity exercise). Do strengthening exercises at least twice a week. This is in addition to the moderate-intensity exercise. Spend less time sitting. Even light physical activity can be beneficial. Watch cholesterol and blood lipids Have your blood tested for lipids and cholesterol at 59 years of age, then have this test every 5 years. You may need to have your cholesterol levels checked more often if: Your lipid or cholesterol levels are high. You are older than 59 years of age. You are at high risk for heart disease. What should I know about cancer screening? Many types of cancers can be detected early and may often be prevented. Depending on your health history and family history, you may need to have cancer screening at various ages. This may include screening for: Colorectal cancer. Prostate cancer. Skin cancer. Lung  cancer. What should I know about heart disease, diabetes, and high blood pressure? Blood pressure and heart disease High blood pressure causes heart disease and increases the risk of stroke. This is more likely to develop in people who have high blood pressure readings or are overweight. Talk with your health care provider about your target blood pressure readings. Have your blood pressure checked: Every 3-5 years if you are 18-39 years of age. Every year if you are 40 years old or older. If you are between the ages of 65 and 75 and are a current or former smoker, ask your health care provider if you should have a one-time screening for abdominal aortic aneurysm (AAA). Diabetes Have regular diabetes screenings. This checks your fasting blood sugar level. Have the screening done: Once every three years after age 45 if you are at a normal weight and have a low risk for diabetes. More often and at a younger age if you are overweight or have a high risk for diabetes. What should I know about preventing infection? Hepatitis B If you have a higher risk for hepatitis B, you should be screened for this virus. Talk with your health care provider to find out if you are at risk for hepatitis B infection. Hepatitis C Blood testing is recommended for: Everyone born from 1945 through 1965. Anyone with known risk factors for hepatitis C. Sexually transmitted infections (STIs) You should be screened each year for STIs, including gonorrhea and chlamydia, if: You are sexually active and are younger than 59 years of age. You are older than 59 years of age and your   health care provider tells you that you are at risk for this type of infection. Your sexual activity has changed since you were last screened, and you are at increased risk for chlamydia or gonorrhea. Ask your health care provider if you are at risk. Ask your health care provider about whether you are at high risk for HIV. Your health care provider  may recommend a prescription medicine to help prevent HIV infection. If you choose to take medicine to prevent HIV, you should first get tested for HIV. You should then be tested every 3 months for as long as you are taking the medicine. Follow these instructions at home: Alcohol use Do not drink alcohol if your health care provider tells you not to drink. If you drink alcohol: Limit how much you have to 0-2 drinks a day. Know how much alcohol is in your drink. In the [place] one drink equals one 12 oz bottle of beer (355 mL), one 5 oz glass of wine (148 mL), or one 1 oz glass of hard liquor (44 mL). Lifestyle Do not use any products that contain nicotine or tobacco. These products include cigarettes, chewing tobacco, and vaping devices, such as e-cigarettes. If you need help quitting, ask your health care provider. Do not use street drugs. Do not share needles. Ask your health care provider for help if you need support or information about quitting drugs. General instructions Schedule regular health, dental, and eye exams. Stay current with your vaccines. Tell your health care provider if: You often feel depressed. You have ever been abused or do not feel safe at home. Summary Adopting a healthy lifestyle and getting preventive care are important in promoting health and wellness. Follow your health care provider's instructions about healthy diet, exercising, and getting tested or screened for diseases. Follow your health care provider's instructions on monitoring your cholesterol and blood pressure. This information is not intended to replace advice given to you by your health care provider. Make sure you discuss any questions you have with your health care provider. Document Revised: 03/03/2021 Document Reviewed: 03/03/2021 Elsevier Patient Education  2023 [employer]  

## 2022-09-28 NOTE — Progress Notes (Signed)
Subjective:  Patient ID: Dominic Coleman, male    DOB: 1961-10-24  Age: 61 y.o. MRN: [id number]  CC: Annual Exam, Hypertension, and Gastroesophageal Reflux   HPI Nieves Barberi presents for a CPX and f/up -  He complains of chronic dyspnea on exertion but a month ago he had a sensation of heart fluttering associated with DOE.  He denies diaphoresis, dizziness, lightheadedness, or near syncope.  He also complains of a 1 year history of diffuse right thigh pain.  He describes an injury while being on a [employer].  He has not taken anything for the pain.  He denies lower extremity paresthesias.  Outpatient Medications Prior to Visit  Medication Sig Dispense Refill   Multiple Vitamin (MULTIVITAMIN) tablet Take 1 tablet by mouth daily.     thiamine (VITAMIN B-1) 50 MG tablet Take 1 tablet (50 mg total) by mouth every other day. 45 tablet 1   No facility-administered medications prior to visit.    ROS Review of Systems  Constitutional: Negative.  Negative for chills, diaphoresis, fatigue and fever.  HENT: Negative.    Eyes: Negative.   Respiratory:  Positive for shortness of breath. Negative for cough, choking, chest tightness and wheezing.   Cardiovascular:  Positive for palpitations. Negative for chest pain and leg swelling.  Gastrointestinal:  Negative for abdominal pain, constipation, diarrhea, nausea and vomiting.  Endocrine: Negative.   Genitourinary: Negative.  Negative for difficulty urinating.  Musculoskeletal:  Positive for arthralgias. Negative for back pain, myalgias and neck pain.  Skin: Negative.   Neurological: Negative.  Negative for dizziness, weakness, light-headedness and numbness.  Hematological:  Negative for adenopathy. Does not bruise/bleed easily.  Psychiatric/Behavioral: Negative.      Objective:  BP 138/82 (BP Location: Left Arm, Patient Position: Sitting, Cuff Size: Large)   Pulse 70   Temp 98.6 F (37 C) (Oral)   Resp 16   Ht 5' 8"  (1.727 m)   Wt  160 lb (72.6 kg)   SpO2 99%   BMI 24.33 kg/m   BP Readings from Last 3 Encounters:  09/28/22 138/82  06/12/21 118/78  09/23/20 136/82    Wt Readings from Last 3 Encounters:  09/28/22 160 lb (72.6 kg)  06/12/21 157 lb 3.2 oz (71.3 kg)  09/23/20 157 lb (71.2 kg)    Physical Exam Vitals reviewed.  HENT:     Nose: Nose normal.     Mouth/Throat:     Mouth: Mucous membranes are moist.  Eyes:     General: No scleral icterus.    Conjunctiva/sclera: Conjunctivae normal.  Cardiovascular:     Rate and Rhythm: Normal rate and regular rhythm.     Heart sounds: Normal heart sounds, S1 normal and S2 normal. No murmur heard.    No gallop.     Comments: EKG- NSR, 60 bpm LVH Antero-septal infarct pattern is unchanged   Pulmonary:     Effort: Pulmonary effort is normal.     Breath sounds: No stridor. No wheezing, rhonchi or rales.  Abdominal:     General: Abdomen is flat.     Palpations: There is no mass.     Tenderness: There is no abdominal tenderness. There is no guarding.     Hernia: No hernia is present. There is no hernia in the left inguinal area or right inguinal area.  Genitourinary:    Pubic Area: No rash.      Penis: Normal and uncircumcised.      Testes: Normal.  Epididymis:     Right: Normal.     Left: Normal.     Prostate: Not enlarged, not tender and no nodules present.     Rectum: Normal. Guaiac result negative. No mass, tenderness, anal fissure, external hemorrhoid or internal hemorrhoid. Normal anal tone.  Musculoskeletal:        General: No swelling. Normal range of motion.     Cervical back: Neck supple.     Right hip: Normal. Normal range of motion.     Left hip: Normal. Normal range of motion.     Right upper leg: Normal. No swelling, edema or deformity.     Left upper leg: No swelling, edema or deformity.     Right lower leg: Normal. No edema.     Left lower leg: Normal. No edema.  Lymphadenopathy:     Cervical: No cervical adenopathy.     Lower  Body: No right inguinal adenopathy. No left inguinal adenopathy.  Skin:    General: Skin is warm and dry.     Findings: No rash.  Neurological:     General: No focal deficit present.     Mental Status: He is alert. Mental status is at baseline.  Psychiatric:        Mood and Affect: Mood normal.        Behavior: Behavior normal.     Lab Results  Component Value Date   WBC 2.6 (L) 09/28/2022   HGB 13.4 09/28/2022   HCT 39.2 09/28/2022   PLT 278.0 09/28/2022   GLUCOSE 91 09/28/2022   CHOL 188 09/28/2022   TRIG 65.0 09/28/2022   HDL 63.80 09/28/2022   LDLCALC 111 (H) 09/28/2022   ALT 30 09/28/2022   AST 32 09/28/2022   NA 141 09/28/2022   K 4.0 09/28/2022   CL 105 09/28/2022   CREATININE 0.93 09/28/2022   BUN 19 09/28/2022   CO2 30 09/28/2022   TSH 1.87 09/28/2022   PSA 0.81 09/28/2022   INR 1.2 (H) 11/15/2019   HGBA1C 5.9 08/02/2020    US Abdomen Limited RUQ  Result Date: 12/18/2019 CLINICAL DATA:  61 year old male with history of elevated liver function tests. EXAM: ULTRASOUND ABDOMEN LIMITED RIGHT UPPER QUADRANT COMPARISON:  No priors. FINDINGS: Gallbladder: No gallstones or wall thickening visualized. No sonographic Murphy sign noted by sonographer. Common bile duct: Diameter: 1.7 mm Liver: No focal lesion identified. Within normal limits in parenchymal echogenicity. Portal vein is patent on color Doppler imaging with normal direction of blood flow towards the liver. Other: None. IMPRESSION: 1. No acute findings. Specifically, no evidence of cholelithiasis, choledocholithiasis or acute cholecystitis. Electronically Signed   By: Vinnie Langton M.D.   On: 12/18/2019 08:48   DG FEMUR, MIN 2 VIEWS RIGHT  Result Date: 09/28/2022 CLINICAL DATA:  Pain and tingling. EXAM: RIGHT FEMUR 2 VIEWS COMPARISON:  None Available. FINDINGS: There is no evidence of fracture or other focal bone lesions. Soft tissues are unremarkable. IMPRESSION: Negative. Electronically Signed   By: Ronney Asters M.D.   On: 09/28/2022 21:23   DG Chest 2 View  Result Date: 09/28/2022 CLINICAL DATA:  Ppd followup.  Heart fluttering for 1 month. EXAM: CHEST - 2 VIEW COMPARISON:  04/26/2013 FINDINGS: Heart size normal. No radiographic evidence for lymphadenopathy. The lungs are free focal consolidations and pleural effusions. No pulmonary edema. IMPRESSION: No active cardiopulmonary disease. Electronically Signed   By: Nolon Nations M.D.   On: 09/28/2022 15:29     Assessment & Plan:  Reise was seen today for annual exam, hypertension and gastroesophageal reflux.  Diagnoses and all orders for this visit:  Gastroesophageal reflux disease with esophagitis and hemorrhage- He has no symptoms related to this. His H&H are normal. -     CBC with Differential/Platelet; Future -     CBC with Differential/Platelet  Thiamine deficiency -     CBC with Differential/Platelet; Future -     Vitamin B1; Future -     Vitamin B1 -     CBC with Differential/Platelet  Routine general medical examination at a health care facility- Exam completed, labs reviewed, vaccines reviewed and updated, cancer screenings are up-to-date, patient education was given. -     Lipid panel; Future -     PSA; Future -     PSA -     Lipid panel  Positive PPD, treated- Chest x-ray is negative for signs of TB. -     DG Chest 2 View; Future  Abnormal EKG  Thigh pain, musculoskeletal, right- Exam and x-rays are reassuring. -     DG FEMUR MIN 2 VIEWS LEFT; Future -     DG FEMUR, MIN 2 VIEWS RIGHT; Future  DOE (dyspnea on exertion)-his EKG is abnormal but unchanged.  Will evaluate for ischemia. -     Brain natriuretic peptide; Future -     Troponin I (High Sensitivity); Future -     EKG 12-Lead -     MYOCARDIAL PERFUSION IMAGING; Future -     Cardiac Stress Test: Informed Consent Details: Physician/Practitioner Attestation; Transcribe to consent form and obtain patient signature; Future -     Troponin I (High  Sensitivity) -     Brain natriuretic peptide  Rapid palpitations- Labs and EKG are reassuring.  I recommended that he wear a cardiac event monitor to evaluate for dysrhythmias. -     TSH; Future -     Brain natriuretic peptide; Future -     Basic metabolic panel; Future -     Troponin I (High Sensitivity); Future -     EKG 12-Lead -     CARDIAC EVENT MONITOR; Future -     Troponin I (High Sensitivity) -     Basic metabolic panel -     Brain natriuretic peptide -     TSH  Hyperlipidemia, unspecified hyperlipidemia type-Statin is not indicated. -     Hepatic function panel; Future -     Hepatic function panel  Abnormal electrocardiogram -     MYOCARDIAL PERFUSION IMAGING; Future -     Cardiac Stress Test: Informed Consent Details: Physician/Practitioner Attestation; Transcribe to consent form and obtain patient signature; Future  LVH (left ventricular hypertrophy) due to hypertensive disease, without heart failure- His blood pressure is well-controlled and he has a normal volume status.  Other orders -     Tdap vaccine greater than or equal to 7yo IM   I am having Dominic Coleman maintain his multivitamin and thiamine.  No orders of the defined types were placed in this encounter.    Follow-up: Return in about 3 months (around 12/28/2022).  Scarlette Calico, MD

## 2022-10-01 LAB — VITAMIN B1: Vitamin B1 (Thiamine): 14 nmol/L (ref 8–30)

## 2022-10-05 ENCOUNTER — Ambulatory Visit: Payer: 59 | Attending: Internal Medicine

## 2022-10-05 DIAGNOSIS — R002 Palpitations: Secondary | ICD-10-CM

## 2024-03-17 LAB — HEPATIC FUNCTION PANEL
ALT: 25 U/L (ref 10–40)
AST: 34 (ref 14–40)
Alkaline Phosphatase: 97 (ref 25–125)
Bilirubin, Total: 0.2

## 2024-03-17 LAB — HEMOGLOBIN A1C: Hemoglobin A1C: 5.8

## 2024-03-17 LAB — LIPID PANEL
Cholesterol: 186 (ref 0–200)
HDL: 68 (ref 35–70)
LDL Cholesterol: 101
Triglycerides: 92 (ref 40–160)

## 2024-03-17 LAB — BASIC METABOLIC PANEL WITH GFR
BUN: 17 (ref 4–21)
CO2: 22 (ref 13–22)
Chloride: 103 (ref 99–108)
Creatinine: 1 (ref 0.6–1.3)
Glucose: 95
Potassium: 4.1 meq/L (ref 3.5–5.1)
Sodium: 140 (ref 137–147)

## 2024-03-17 LAB — CBC AND DIFFERENTIAL
HCT: 40 — AB (ref 41–53)
Hemoglobin: 12.9 — AB (ref 13.5–17.5)
Platelets: 277 10*3/uL (ref 150–400)
WBC: 3.9

## 2024-03-18 LAB — COMPREHENSIVE METABOLIC PANEL WITH GFR
Albumin: 4.3 (ref 3.5–5.0)
Calcium: 9.3 (ref 8.7–10.7)
EGFR: 90

## 2024-04-12 ENCOUNTER — Ambulatory Visit (INDEPENDENT_AMBULATORY_CARE_PROVIDER_SITE_OTHER)

## 2024-04-12 ENCOUNTER — Ambulatory Visit: Admitting: Internal Medicine

## 2024-04-12 ENCOUNTER — Ambulatory Visit

## 2024-04-12 ENCOUNTER — Ambulatory Visit: Payer: Self-pay | Admitting: Internal Medicine

## 2024-04-12 VITALS — BP 130/86 | HR 84 | Temp 98.7°F | Resp 16 | Ht 68.0 in | Wt 156.6 lb

## 2024-04-12 DIAGNOSIS — G8929 Other chronic pain: Secondary | ICD-10-CM | POA: Diagnosis not present

## 2024-04-12 DIAGNOSIS — M25512 Pain in left shoulder: Secondary | ICD-10-CM

## 2024-04-12 DIAGNOSIS — I119 Hypertensive heart disease without heart failure: Secondary | ICD-10-CM

## 2024-04-12 DIAGNOSIS — Z0001 Encounter for general adult medical examination with abnormal findings: Secondary | ICD-10-CM | POA: Insufficient documentation

## 2024-04-12 DIAGNOSIS — D539 Nutritional anemia, unspecified: Secondary | ICD-10-CM | POA: Diagnosis not present

## 2024-04-12 DIAGNOSIS — Z0000 Encounter for general adult medical examination without abnormal findings: Secondary | ICD-10-CM

## 2024-04-12 DIAGNOSIS — E519 Thiamine deficiency, unspecified: Secondary | ICD-10-CM | POA: Diagnosis not present

## 2024-04-12 DIAGNOSIS — E785 Hyperlipidemia, unspecified: Secondary | ICD-10-CM

## 2024-04-12 DIAGNOSIS — M19012 Primary osteoarthritis, left shoulder: Secondary | ICD-10-CM

## 2024-04-12 DIAGNOSIS — K635 Polyp of colon: Secondary | ICD-10-CM | POA: Insufficient documentation

## 2024-04-12 LAB — IBC + FERRITIN
Ferritin: 213 ng/mL (ref 22.0–322.0)
Iron: 60 ug/dL (ref 42–165)
Saturation Ratios: 20.5 % (ref 20.0–50.0)
TIBC: 292.6 ug/dL (ref 250.0–450.0)
Transferrin: 209 mg/dL — ABNORMAL LOW (ref 212.0–360.0)

## 2024-04-12 LAB — FOLATE: Folate: 21.7 ng/mL (ref >5.9)

## 2024-04-12 LAB — TSH: TSH: 1.38 u[IU]/mL (ref 0.35–5.50)

## 2024-04-12 LAB — VITAMIN B12: Vitamin B-12: 817 pg/mL (ref 211–911)

## 2024-04-12 LAB — PSA: PSA: 0.91 ng/mL (ref 0.10–4.00)

## 2024-04-12 MED ORDER — VITAMIN B-1 50 MG PO TABS: 50.0000 mg | ORAL_TABLET | ORAL | 1 refills | Status: AC

## 2024-04-12 NOTE — Patient Instructions (Signed)
 Health Maintenance, Male  Adopting a healthy lifestyle and getting preventive care are important in promoting health and wellness. Ask your health care provider about:  The right schedule for you to have regular tests and exams.  Things you can do on your own to prevent diseases and keep yourself healthy.  What should I know about diet, weight, and exercise?  Eat a healthy diet    Eat a diet that includes plenty of vegetables, fruits, low-fat dairy products, and lean protein.  Do not eat a lot of foods that are high in solid fats, added sugars, or sodium.  Maintain a healthy weight  Body mass index (BMI) is a measurement that can be used to identify possible weight problems. It estimates body fat based on height and weight. Your health care provider can help determine your BMI and help you achieve or maintain a healthy weight.  Get regular exercise  Get regular exercise. This is one of the most important things you can do for your health. Most adults should:  Exercise for at least 150 minutes each week. The exercise should increase your heart rate and make you sweat (moderate-intensity exercise).  Do strengthening exercises at least twice a week. This is in addition to the moderate-intensity exercise.  Spend less time sitting. Even light physical activity can be beneficial.  Watch cholesterol and blood lipids  Have your blood tested for lipids and cholesterol at 61 years of age, then have this test every 5 years.  You may need to have your cholesterol levels checked more often if:  Your lipid or cholesterol levels are high.  You are older than 61 years of age.  You are at high risk for heart disease.  What should I know about cancer screening?  Many types of cancers can be detected early and may often be prevented. Depending on your health history and family history, you may need to have cancer screening at various ages. This may include screening for:  Colorectal cancer.  Prostate cancer.  Skin cancer.  Lung  cancer.  What should I know about heart disease, diabetes, and high blood pressure?  Blood pressure and heart disease  High blood pressure causes heart disease and increases the risk of stroke. This is more likely to develop in people who have high blood pressure readings or are overweight.  Talk with your health care provider about your target blood pressure readings.  Have your blood pressure checked:  Every 3-5 years if you are 9-95 years of age.  Every year if you are 85 years old or older.  If you are between the ages of 29 and 29 and are a current or former smoker, ask your health care provider if you should have a one-time screening for abdominal aortic aneurysm (AAA).  Diabetes  Have regular diabetes screenings. This checks your fasting blood sugar level. Have the screening done:  Once every three years after age 23 if you are at a normal weight and have a low risk for diabetes.  More often and at a younger age if you are overweight or have a high risk for diabetes.  What should I know about preventing infection?  Hepatitis B  If you have a higher risk for hepatitis B, you should be screened for this virus. Talk with your health care provider to find out if you are at risk for hepatitis B infection.  Hepatitis C  Blood testing is recommended for:  Everyone born from [birth year] through 1965.  Anyone  with known risk factors for hepatitis C.  Sexually transmitted infections (STIs)  You should be screened each year for STIs, including gonorrhea and chlamydia, if:  You are sexually active and are younger than 62 years of age.  You are older than 61 years of age and your health care provider tells you that you are at risk for this type of infection.  Your sexual activity has changed since you were last screened, and you are at increased risk for chlamydia or gonorrhea. Ask your health care provider if you are at risk.  Ask your health care provider about whether you are at high risk for HIV. Your health care provider  may recommend a prescription medicine to help prevent HIV infection. If you choose to take medicine to prevent HIV, you should first get tested for HIV. You should then be tested every 3 months for as long as you are taking the medicine.  Follow these instructions at home:  Alcohol use  Do not drink alcohol if your health care provider tells you not to drink.  If you drink alcohol:  Limit how much you have to 0-2 drinks a day.  Know how much alcohol is in your drink. In the [place] one drink equals one 12 oz bottle of beer (355 mL), one 5 oz glass of wine (148 mL), or one 1 oz glass of hard liquor (44 mL).  Lifestyle  Do not use any products that contain nicotine or tobacco. These products include cigarettes, chewing tobacco, and vaping devices, such as e-cigarettes. If you need help quitting, ask your health care provider.  Do not use street drugs.  Do not share needles.  Ask your health care provider for help if you need support or information about quitting drugs.  General instructions  Schedule regular health, dental, and eye exams.  Stay current with your vaccines.  Tell your health care provider if:  You often feel depressed.  You have ever been abused or do not feel safe at home.  Summary  Adopting a healthy lifestyle and getting preventive care are important in promoting health and wellness.  Follow your health care provider's instructions about healthy diet, exercising, and getting tested or screened for diseases.  Follow your health care provider's instructions on monitoring your cholesterol and blood pressure.  This information is not intended to replace advice given to you by your health care provider. Make sure you discuss any questions you have with your health care provider.  Document Revised: 03/03/2021 Document Reviewed: 03/03/2021  Elsevier Patient Education  2024 [employer].

## 2024-04-12 NOTE — Progress Notes (Unsigned)
 Subjective:  Patient ID: Dominic Coleman, male    DOB: 1961-10-07  Age: 63 y.o. MRN: [id number]  CC: Annual Exam (Left shoulder pain with tingling and numbness radiating down his left arm into his hand and fingers. It has been consistent for about a year now.  ), Hypertension, and Anemia   HPI Dominic Coleman presents for a CPX and f/up ----   Discussed the use of AI scribe software for clinical note transcription with the patient, who gave verbal consent to proceed.  History of Present Illness   Dominic Coleman is a 63 year old male who presents with intermittent palpitations and shoulder pain.  He experiences intermittent palpitations, particularly during excitement or physical exertion, such as running. These episodes have been occurring since he wore a heart monitor for thirty days. He manages the symptoms by slowing down during exercise to allow his heart rate to normalize. No associated chest pain, shortness of breath, dizziness, lightheadedness, headache, or blurred vision during these episodes. Initially, he experienced dizziness and lightheadedness when the palpitations first started, but these symptoms are not present currently.  He has left shoulder pain characterized by soreness, a grinding sound, and a sensation of locking up, particularly in the morning. He experiences numbness and tingling from the shoulder down to his fingers, with a cool sensation in the arm. No neck symptoms or recent shoulder injury, but he recalls a past pulled muscle under the shoulder. He suspects the shoulder issue may have arisen from work-related activities.  He also reports a history of anemia identified in May, though he was unaware of it at the time. No symptoms typically associated with anemia, such as weakness, bleeding, or bruising, except for the tingling in his left arm.       Outpatient Medications Prior to Visit  Medication Sig Dispense Refill   Multiple Vitamin (MULTIVITAMIN) tablet Take 1  tablet by mouth daily.     thiamine (VITAMIN B-1) 50 MG tablet Take 1 tablet (50 mg total) by mouth every other day. 45 tablet 1   No facility-administered medications prior to visit.    ROS Review of Systems  Objective:  BP 130/86 (BP Location: Left Arm, Patient Position: Sitting, Cuff Size: Normal) Comment: BP (R) 130/86  Pulse 84   Temp 98.7 F (37.1 C) (Oral)   Resp 16   Ht 5' 8 (1.727 m)   Wt 156 lb 9.6 oz (71 kg)   SpO2 98%   BMI 23.81 kg/m   BP Readings from Last 3 Encounters:  04/12/24 130/86  09/28/22 138/82  06/12/21 118/78    Wt Readings from Last 3 Encounters:  04/12/24 156 lb 9.6 oz (71 kg)  09/28/22 160 lb (72.6 kg)  06/12/21 157 lb 3.2 oz (71.3 kg)    Physical Exam  Cardiovascular:     Rate and Rhythm: Regular rhythm. Bradycardia present.     Heart sounds: Normal heart sounds, S1 normal and S2 normal.     Comments: EKG--- SB, 57 bpm LVH  Anteroseptal infarct pattern  Unchanged Abdominal:     Hernia: There is no hernia in the left inguinal area or right inguinal area.  Genitourinary:    Pubic Area: No rash.      Penis: Normal and uncircumcised.      Testes: Normal.     Epididymis:     Right: Normal.     Left: Normal.     Prostate: Normal. Not enlarged, not tender and no nodules present.  Rectum: Normal. Guaiac result negative. No mass, tenderness, anal fissure, external hemorrhoid or internal hemorrhoid. Normal anal tone.   Musculoskeletal:     Right shoulder: Normal.     Left shoulder: No swelling or bony tenderness. Decreased range of motion.     Right lower leg: No edema.     Left lower leg: No edema.  Lymphadenopathy:     Lower Body: No right inguinal adenopathy. No left inguinal adenopathy.     Lab Results  Component Value Date   WBC 3.9 03/17/2024   HGB 12.9 (A) 03/17/2024   HCT 40 (A) 03/17/2024   PLT 277 03/17/2024   GLUCOSE 91 09/28/2022   CHOL 186 03/17/2024   TRIG 92 03/17/2024   HDL 68 03/17/2024   LDLCALC 101  03/17/2024   ALT 25 03/17/2024   AST 34 03/17/2024   NA 140 03/17/2024   K 4.1 03/17/2024   CL 103 03/17/2024   CREATININE 1.0 03/17/2024   BUN 17 03/17/2024   CO2 22 03/17/2024   TSH 1.87 09/28/2022   PSA 0.81 09/28/2022   INR 1.2 (H) 11/15/2019   HGBA1C 5.8 03/17/2024    US  Abdomen Limited RUQ Result Date: 12/18/2019 CLINICAL DATA:  63 year old male with history of elevated liver function tests. EXAM: ULTRASOUND ABDOMEN LIMITED RIGHT UPPER QUADRANT COMPARISON:  No priors. FINDINGS: Gallbladder: No gallstones or wall thickening visualized. No sonographic Murphy sign noted by sonographer. Common bile duct: Diameter: 1.7 mm Liver: No focal lesion identified. Within normal limits in parenchymal echogenicity. Portal vein is patent on color Doppler imaging with normal direction of blood flow towards the liver. Other: None. IMPRESSION: 1. No acute findings. Specifically, no evidence of cholelithiasis, choledocholithiasis or acute cholecystitis. Electronically Signed   By: Alexandria Angel M.D.   On: 12/18/2019 08:48    DG Shoulder Left Result Date: 04/12/2024 CLINICAL DATA:  Recent left shoulder pain.  No known injury. EXAM: LEFT SHOULDER - 2+ VIEW COMPARISON:  None Available. FINDINGS: Minimal inferior glenoid degenerative spurring. The glenohumeral joint space is maintained. Mild inferior acromioclavicular joint space narrowing. No acute fracture or dislocation. The visualized portion of the left lung is unremarkable. IMPRESSION: 1. Minimal inferior glenoid degenerative spurring. 2. Mild inferior acromioclavicular joint space narrowing. Electronically Signed   By: Bertina Broccoli M.D.   On: 04/12/2024 16:41     Assessment & Plan:  Chronic left shoulder pain -     DG Shoulder Left; Future  Thiamine deficiency -     Vitamin B-1; Take 1 tablet (50 mg total) by mouth every other day.  Dispense: 45 tablet; Refill: 1  LVH (left ventricular hypertrophy) due to hypertensive disease, without heart  failure -     TSH; Future -     EKG 12-Lead  Hyperlipidemia, unspecified hyperlipidemia type -     TSH; Future  Encounter for general adult medical examination with abnormal findings -     PSA; Future -     EKG 12-Lead  Deficiency anemia -     Reticulocytes; Future -     Vitamin B12; Future -     Folate; Future -     Zinc; Future -     IBC + Ferritin; Future  Hyperplastic colonic polyp, unspecified part of colon -     Ambulatory referral to Gastroenterology  Primary osteoarthritis of left shoulder -     Ambulatory referral to Orthopedic Surgery     Follow-up: Return in about 6 months (around 10/12/2024).  Sandra Crouch, MD

## 2024-04-16 ENCOUNTER — Other Ambulatory Visit: Payer: Self-pay | Admitting: Internal Medicine

## 2024-04-16 DIAGNOSIS — D538 Other specified nutritional anemias: Secondary | ICD-10-CM | POA: Insufficient documentation

## 2024-04-16 LAB — ZINC: Zinc: 59 ug/dL — ABNORMAL LOW (ref 60–130)

## 2024-04-16 LAB — RETICULOCYTES
ABS Retic: 46750 {cells}/uL (ref 25000–90000)
Retic Ct Pct: 1.1 %

## 2024-04-16 MED ORDER — ZINC GLUCONATE 50 MG PO TABS: 50.0000 mg | ORAL_TABLET | Freq: Every day | ORAL | 1 refills | Status: AC

## 2024-04-24 ENCOUNTER — Telehealth: Payer: Self-pay | Admitting: Internal Medicine

## 2024-04-24 NOTE — Telephone Encounter (Signed)
 Copied from CRM [id number]. Topic: General - Other >> Apr 24, 2024 11:03 AM Henretta I wrote: Reason for CRM: Patient dropped off [facility] paperwork and [facility] medical records and wanted to know if doctor had a chance to look through it and when he would be able to pick it up.

## 2024-04-24 NOTE — Telephone Encounter (Signed)
 Paperwork received.

## 2024-04-24 NOTE — Telephone Encounter (Signed)
 Disregard last note. I am actually looking for the paperwork

## 2024-05-05 NOTE — Telephone Encounter (Signed)
 Patient states that the paperwork was in a blue folder

## 2024-05-18 NOTE — Telephone Encounter (Signed)
 Paperwork and blue folder was in Dr. Joshua office. I have retrieved the folder and will get this paperwork done tomorrow 05/19/24

## 2024-05-24 NOTE — Telephone Encounter (Signed)
 Touched bases with the patient and advised him there isn't a place for us  to sign or fill out. He gave a verbal understanding and stated that he would reach out to the person he spoke with at the [facility] and call me back.

## 2024-05-25 ENCOUNTER — Telehealth: Payer: Self-pay

## 2024-05-25 NOTE — Telephone Encounter (Signed)
 Copied from CRM [id number]. Topic: Medical Record Request - Other >> May 25, 2024  9:54 AM Suzen RAMAN wrote: Reason for CRM: Patient would like a call back from [facility] pertaining to his medical records for the [employer]  CB#[phone]

## 2024-05-25 NOTE — Telephone Encounter (Signed)
 Spoke with patient in regards to his paperwork. He advised me that he wasn't supposed to bring the forms and records top me he was supposed to take them to the [facility]. Stated that he was coming to pick them up today.

## 2024-05-25 NOTE — Telephone Encounter (Signed)
 Spoke with patient and he will be picking his forms up.
